# Patient Record
Sex: Female | Born: 1975 | Race: Black or African American | Hispanic: No | Marital: Married | Smoking: Never smoker
Health system: Southern US, Community
[De-identification: ages and names within clinical notes are randomized; demographics above are authoritative.]

## PROBLEM LIST (undated history)

## (undated) DIAGNOSIS — E119 Type 2 diabetes mellitus without complications: Secondary | ICD-10-CM

## (undated) DIAGNOSIS — C801 Malignant (primary) neoplasm, unspecified: Secondary | ICD-10-CM

## (undated) DIAGNOSIS — K219 Gastro-esophageal reflux disease without esophagitis: Secondary | ICD-10-CM

## (undated) DIAGNOSIS — I739 Peripheral vascular disease, unspecified: Secondary | ICD-10-CM

## (undated) DIAGNOSIS — I82409 Acute embolism and thrombosis of unspecified deep veins of unspecified lower extremity: Secondary | ICD-10-CM

## (undated) DIAGNOSIS — E785 Hyperlipidemia, unspecified: Secondary | ICD-10-CM

## (undated) DIAGNOSIS — Z973 Presence of spectacles and contact lenses: Secondary | ICD-10-CM

## (undated) DIAGNOSIS — N840 Polyp of corpus uteri: Secondary | ICD-10-CM

## (undated) DIAGNOSIS — F419 Anxiety disorder, unspecified: Secondary | ICD-10-CM

## (undated) DIAGNOSIS — N939 Abnormal uterine and vaginal bleeding, unspecified: Secondary | ICD-10-CM

## (undated) HISTORY — DX: Gastro-esophageal reflux disease without esophagitis: K21.9

## (undated) HISTORY — PX: HEMORROIDECTOMY: SUR656

## (undated) HISTORY — PX: TUBAL LIGATION: SHX77

## (undated) HISTORY — PX: ABDOMINAL HYSTERECTOMY: SHX81

## (undated) HISTORY — DX: Acute embolism and thrombosis of unspecified deep veins of unspecified lower extremity: I82.409

## (undated) HISTORY — DX: Type 2 diabetes mellitus without complications: E11.9

---

## 1998-01-01 ENCOUNTER — Inpatient Hospital Stay (HOSPITAL_COMMUNITY): Admission: AD | Admit: 1998-01-01 | Discharge: 1998-01-01 | Payer: Self-pay | Admitting: *Deleted

## 1998-04-07 ENCOUNTER — Inpatient Hospital Stay (HOSPITAL_COMMUNITY): Admission: AD | Admit: 1998-04-07 | Discharge: 1998-04-07 | Payer: Self-pay | Admitting: Obstetrics and Gynecology

## 1998-05-13 ENCOUNTER — Other Ambulatory Visit: Admission: RE | Admit: 1998-05-13 | Discharge: 1998-05-13 | Payer: Self-pay | Admitting: Obstetrics & Gynecology

## 1998-08-22 ENCOUNTER — Inpatient Hospital Stay (HOSPITAL_COMMUNITY): Admission: AD | Admit: 1998-08-22 | Discharge: 1998-08-22 | Payer: Self-pay | Admitting: Obstetrics & Gynecology

## 1998-08-29 ENCOUNTER — Inpatient Hospital Stay (HOSPITAL_COMMUNITY): Admission: AD | Admit: 1998-08-29 | Discharge: 1998-08-29 | Payer: Self-pay | Admitting: *Deleted

## 1998-09-27 ENCOUNTER — Inpatient Hospital Stay (HOSPITAL_COMMUNITY): Admission: AD | Admit: 1998-09-27 | Discharge: 1998-09-29 | Payer: Self-pay | Admitting: Obstetrics & Gynecology

## 1999-10-06 ENCOUNTER — Ambulatory Visit: Admission: RE | Admit: 1999-10-06 | Discharge: 1999-10-06 | Payer: Self-pay | Admitting: Family Medicine

## 1999-10-17 ENCOUNTER — Other Ambulatory Visit: Admission: RE | Admit: 1999-10-17 | Discharge: 1999-10-17 | Payer: Self-pay | Admitting: *Deleted

## 1999-12-02 ENCOUNTER — Ambulatory Visit (HOSPITAL_COMMUNITY): Admission: RE | Admit: 1999-12-02 | Discharge: 1999-12-02 | Payer: Self-pay | Admitting: *Deleted

## 1999-12-02 HISTORY — PX: LAPAROSCOPY WITH TUBAL LIGATION: SHX5576

## 2000-06-08 ENCOUNTER — Ambulatory Visit (HOSPITAL_COMMUNITY): Admission: RE | Admit: 2000-06-08 | Discharge: 2000-06-08 | Payer: Self-pay | Admitting: Family Medicine

## 2000-06-08 ENCOUNTER — Encounter: Payer: Self-pay | Admitting: Family Medicine

## 2000-11-15 ENCOUNTER — Emergency Department (HOSPITAL_COMMUNITY): Admission: EM | Admit: 2000-11-15 | Discharge: 2000-11-15 | Payer: Self-pay | Admitting: Internal Medicine

## 2003-03-30 ENCOUNTER — Emergency Department (HOSPITAL_COMMUNITY): Admission: EM | Admit: 2003-03-30 | Discharge: 2003-03-30 | Payer: Self-pay | Admitting: Emergency Medicine

## 2005-08-23 ENCOUNTER — Emergency Department (HOSPITAL_COMMUNITY): Admission: EM | Admit: 2005-08-23 | Discharge: 2005-08-23 | Payer: Self-pay | Admitting: Emergency Medicine

## 2006-05-10 ENCOUNTER — Emergency Department (HOSPITAL_COMMUNITY): Admission: EM | Admit: 2006-05-10 | Discharge: 2006-05-10 | Payer: Self-pay | Admitting: Emergency Medicine

## 2007-08-11 ENCOUNTER — Ambulatory Visit: Payer: Self-pay | Admitting: Family Medicine

## 2007-08-25 ENCOUNTER — Ambulatory Visit: Payer: Self-pay | Admitting: Family Medicine

## 2007-08-25 ENCOUNTER — Other Ambulatory Visit: Admission: RE | Admit: 2007-08-25 | Discharge: 2007-08-25 | Payer: Self-pay | Admitting: Family Medicine

## 2007-08-25 ENCOUNTER — Encounter: Payer: Self-pay | Admitting: Family Medicine

## 2007-08-25 DIAGNOSIS — E663 Overweight: Secondary | ICD-10-CM | POA: Insufficient documentation

## 2007-08-25 LAB — CONVERTED CEMR LAB
Albumin: 4.3 g/dL (ref 3.5–5.2)
Basophils Absolute: 0 10*3/uL (ref 0.0–0.1)
Bilirubin Urine: NEGATIVE
Chloride: 104 meq/L (ref 96–112)
Cholesterol: 159 mg/dL (ref 0–200)
Eosinophils Absolute: 0.1 10*3/uL (ref 0.0–0.7)
Eosinophils Relative: 0.5 % (ref 0.0–5.0)
Glucose, Bld: 97 mg/dL (ref 70–99)
Glucose, Urine, Semiquant: NEGATIVE
HCT: 39.8 % (ref 36.0–46.0)
Hemoglobin: 12.7 g/dL (ref 12.0–15.0)
Ketones, urine, test strip: NEGATIVE
Lymphocytes Relative: 31.1 % (ref 12.0–46.0)
MCHC: 31.9 g/dL (ref 30.0–36.0)
MCV: 78.5 fL (ref 78.0–100.0)
Magnesium: 2 mg/dL (ref 1.5–2.5)
Monocytes Relative: 4.7 % (ref 3.0–12.0)
Neutro Abs: 7.3 10*3/uL (ref 1.4–7.7)
Neutrophils Relative %: 63.4 % (ref 43.0–77.0)
Platelets: 412 10*3/uL — ABNORMAL HIGH (ref 150–400)
Potassium: 4.4 meq/L (ref 3.5–5.1)
Protein, U semiquant: NEGATIVE
RDW: 14.7 % — ABNORMAL HIGH (ref 11.5–14.6)
Sodium: 139 meq/L (ref 135–145)
Specific Gravity, Urine: 1.01
Total Bilirubin: 0.9 mg/dL (ref 0.3–1.2)
Total CHOL/HDL Ratio: 5
Total Protein: 7.7 g/dL (ref 6.0–8.3)
pH: 5.5

## 2008-06-07 ENCOUNTER — Ambulatory Visit: Payer: Self-pay | Admitting: Family Medicine

## 2008-06-07 DIAGNOSIS — G5600 Carpal tunnel syndrome, unspecified upper limb: Secondary | ICD-10-CM | POA: Insufficient documentation

## 2008-11-30 ENCOUNTER — Ambulatory Visit: Payer: Self-pay | Admitting: Family Medicine

## 2008-12-08 ENCOUNTER — Emergency Department (HOSPITAL_COMMUNITY): Admission: EM | Admit: 2008-12-08 | Discharge: 2008-12-08 | Payer: Self-pay | Admitting: Emergency Medicine

## 2008-12-19 ENCOUNTER — Ambulatory Visit: Payer: Self-pay | Admitting: Internal Medicine

## 2008-12-19 ENCOUNTER — Encounter: Admission: RE | Admit: 2008-12-19 | Discharge: 2008-12-19 | Payer: Self-pay | Admitting: Orthopedic Surgery

## 2008-12-19 ENCOUNTER — Telehealth: Payer: Self-pay | Admitting: Family Medicine

## 2008-12-19 ENCOUNTER — Inpatient Hospital Stay (HOSPITAL_COMMUNITY): Admission: EM | Admit: 2008-12-19 | Discharge: 2008-12-20 | Payer: Self-pay | Admitting: Emergency Medicine

## 2008-12-19 DIAGNOSIS — Z86718 Personal history of other venous thrombosis and embolism: Secondary | ICD-10-CM

## 2008-12-19 HISTORY — DX: Personal history of other venous thrombosis and embolism: Z86.718

## 2008-12-24 ENCOUNTER — Ambulatory Visit: Payer: Self-pay | Admitting: Family Medicine

## 2008-12-24 LAB — CONVERTED CEMR LAB: INR: 1.7

## 2008-12-28 ENCOUNTER — Ambulatory Visit: Payer: Self-pay | Admitting: Family Medicine

## 2008-12-28 LAB — CONVERTED CEMR LAB
INR: 1.5
Prothrombin Time: 14.8 s

## 2008-12-31 ENCOUNTER — Ambulatory Visit: Payer: Self-pay | Admitting: Family Medicine

## 2008-12-31 LAB — CONVERTED CEMR LAB: Prothrombin Time: 15.3 s

## 2009-01-03 ENCOUNTER — Ambulatory Visit: Payer: Self-pay | Admitting: Family Medicine

## 2009-01-10 ENCOUNTER — Ambulatory Visit: Payer: Self-pay | Admitting: Family Medicine

## 2009-01-10 LAB — CONVERTED CEMR LAB: INR: 2.9

## 2009-01-17 ENCOUNTER — Ambulatory Visit: Payer: Self-pay | Admitting: Family Medicine

## 2009-01-17 LAB — CONVERTED CEMR LAB: Prothrombin Time: 18.8 s

## 2009-02-14 ENCOUNTER — Ambulatory Visit: Payer: Self-pay | Admitting: Family Medicine

## 2009-02-14 LAB — CONVERTED CEMR LAB: Prothrombin Time: 17.5 s

## 2009-03-18 ENCOUNTER — Ambulatory Visit: Payer: Self-pay | Admitting: Family Medicine

## 2009-03-18 LAB — CONVERTED CEMR LAB
INR: 2.1
Prothrombin Time: 17.8 s

## 2009-04-30 ENCOUNTER — Ambulatory Visit: Payer: Self-pay | Admitting: Family Medicine

## 2009-07-02 HISTORY — PX: FLEXIBLE SIGMOIDOSCOPY: SHX1649

## 2009-07-03 ENCOUNTER — Encounter: Payer: Self-pay | Admitting: Gastroenterology

## 2009-07-03 ENCOUNTER — Ambulatory Visit (HOSPITAL_COMMUNITY): Admission: RE | Admit: 2009-07-03 | Discharge: 2009-07-03 | Payer: Self-pay | Admitting: Gastroenterology

## 2009-07-03 ENCOUNTER — Ambulatory Visit: Payer: Self-pay | Admitting: Gastroenterology

## 2009-07-03 DIAGNOSIS — K648 Other hemorrhoids: Secondary | ICD-10-CM | POA: Insufficient documentation

## 2009-07-04 ENCOUNTER — Telehealth: Payer: Self-pay | Admitting: Gastroenterology

## 2009-07-09 ENCOUNTER — Telehealth: Payer: Self-pay | Admitting: Gastroenterology

## 2009-07-31 ENCOUNTER — Telehealth (INDEPENDENT_AMBULATORY_CARE_PROVIDER_SITE_OTHER): Payer: Self-pay | Admitting: *Deleted

## 2009-09-05 ENCOUNTER — Emergency Department (HOSPITAL_COMMUNITY): Admission: EM | Admit: 2009-09-05 | Discharge: 2009-09-06 | Payer: Self-pay | Admitting: Emergency Medicine

## 2010-05-08 ENCOUNTER — Emergency Department (HOSPITAL_COMMUNITY)
Admission: EM | Admit: 2010-05-08 | Discharge: 2010-05-09 | Payer: Self-pay | Source: Home / Self Care | Admitting: Emergency Medicine

## 2010-05-15 ENCOUNTER — Other Ambulatory Visit
Admission: RE | Admit: 2010-05-15 | Discharge: 2010-05-15 | Payer: Self-pay | Source: Home / Self Care | Admitting: Family Medicine

## 2010-05-15 ENCOUNTER — Ambulatory Visit: Payer: Self-pay | Admitting: Family Medicine

## 2010-05-15 ENCOUNTER — Encounter: Payer: Self-pay | Admitting: Family Medicine

## 2010-05-16 ENCOUNTER — Telehealth (INDEPENDENT_AMBULATORY_CARE_PROVIDER_SITE_OTHER): Payer: Self-pay | Admitting: *Deleted

## 2010-05-16 LAB — CONVERTED CEMR LAB
AST: 25 units/L (ref 0–37)
Bilirubin, Direct: 0.1 mg/dL (ref 0.0–0.3)
CO2: 26 meq/L (ref 19–32)
Calcium: 9.6 mg/dL (ref 8.4–10.5)
Chloride: 105 meq/L (ref 96–112)
Creatinine, Ser: 0.7 mg/dL (ref 0.4–1.2)
Eosinophils Absolute: 0 10*3/uL (ref 0.0–0.7)
Eosinophils Relative: 0.3 % (ref 0.0–5.0)
GFR calc non Af Amer: 120.69 mL/min (ref >60.00)
Glucose, Bld: 90 mg/dL (ref 70–99)
LDL Cholesterol: 97 mg/dL (ref 0–99)
Lymphocytes Relative: 31.6 % (ref 12.0–46.0)
Lymphs Abs: 3.6 10*3/uL (ref 0.7–4.0)
MCV: 74.8 fL — ABNORMAL LOW (ref 78.0–100.0)
Neutrophils Relative %: 61.8 % (ref 43.0–77.0)
RBC: 4.68 M/uL (ref 3.87–5.11)
Sodium: 140 meq/L (ref 135–145)
Total Bilirubin: 0.5 mg/dL (ref 0.3–1.2)
Total CHOL/HDL Ratio: 5
Triglycerides: 124 mg/dL (ref 0.0–149.0)
VLDL: 24.8 mg/dL (ref 0.0–40.0)
Vit D, 25-Hydroxy: 16 ng/mL — ABNORMAL LOW (ref 30–89)

## 2010-05-19 ENCOUNTER — Encounter (INDEPENDENT_AMBULATORY_CARE_PROVIDER_SITE_OTHER): Payer: Self-pay | Admitting: *Deleted

## 2010-05-19 LAB — CONVERTED CEMR LAB: Pap Smear: NEGATIVE

## 2010-07-03 NOTE — Assessment & Plan Note (Signed)
Summary: tran from [email or web address]   Vital Signs:  Patient profile:   35 year old female Height:      67 inches Weight:      228 pounds BMI:     35.84 Pulse rate:   113 / minute BP sitting:   118 / 80  (left arm)  Vitals Entered By: Doristine Devoid CMA (May 15, 2010 10:44 AM) CC: CPX AND LAB W/ PAP   History of Present Illness: 35 yo woman here today for CPE.  no concerns today.  Preventive Screening-Counseling & Management  Alcohol-Tobacco     Alcohol drinks/day: <1     Smoking Status: never  Caffeine-Diet-Exercise     Does Patient Exercise: no      Sexual History:  currently monogamous.        Drug Use:  never.    Current Medications (verified): 1)  Bayer Childrens Aspirin 81 Mg Chew (Aspirin) .... Take One Tablet Daily  Allergies (verified): No Known Drug Allergies  Past History:  Past medical, surgical, family and social histories (including risk factors) reviewed, and no changes noted (except as noted below).  Past Medical History: Reviewed history from 07/03/2009 and no changes required. childbirth x 2 DVT GERD Hemorrhoids Rectal Bleeding  Past Surgical History: Reviewed history from 04/30/2009 and no changes required. Tubal ligation  Family History: Reviewed history from 07/03/2009 and no changes required. father 17, alive and well mother 35, alive and well two half brothers in good healthone full sister in good health No FH of Colon Cancer breast cancer- no  Social History: Reviewed history from 07/03/2009 and no changes required. Occupation: Web designer Married Never Smoked Drug use-no Regular exercise-no Alcohol Use - yes-2 glasses weekly Daily Caffeine Use-3 cups daily Drug Use:  never Sexual History:  currently monogamous  Review of Systems  The patient denies anorexia, fever, weight loss, weight gain, vision loss, decreased hearing, hoarseness, chest pain, syncope, dyspnea on exertion, peripheral edema,  prolonged cough, headaches, abdominal pain, melena, hematochezia, severe indigestion/heartburn, hematuria, suspicious skin lesions, depression, abnormal bleeding, enlarged lymph nodes, and breast masses.    Physical Exam  General:  Well-developed,well-nourished,in no acute distress; alert,appropriate and cooperative throughout examination Head:  Normocephalic and atraumatic without obvious abnormalities. No apparent alopecia or balding. Eyes:  No corneal or conjunctival inflammation noted. EOMI. Perrla. Funduscopic exam benign, without hemorrhages, exudates or papilledema. Vision grossly normal. Ears:  External ear exam shows no significant lesions or deformities.  Otoscopic examination reveals clear canals, tympanic membranes are intact bilaterally without bulging, retraction, inflammation or discharge. Hearing is grossly normal bilaterally. Nose:  External nasal examination shows no deformity or inflammation. Nasal mucosa are pink and moist without lesions or exudates. Mouth:  Oral mucosa and oropharynx without lesions or exudates.  Teeth in good repair. Neck:  No deformities, masses, or tenderness noted. Breasts:  No mass, nodules, thickening, tenderness, bulging, retraction, inflamation, nipple discharge or skin changes noted.   Lungs:  Normal respiratory effort, chest expands symmetrically. Lungs are clear to auscultation, no crackles or wheezes. Heart:  Normal rate and regular rhythm. S1 and S2 normal without gallop, murmur, click, rub or other extra sounds. Abdomen:  Bowel sounds positive,abdomen soft and non-tender without masses, organomegaly or hernias noted. Genitalia:  Pelvic Exam:        External: normal female genitalia without lesions or masses        Vagina: normal without lesions or masses        Cervix: normal without lesions or  masses        Adnexa: normal bimanual exam without masses or fullness        Uterus: normal by palpation        Pap smear: performed Pulses:  +2  carotid, radial, DP Extremities:  No clubbing, cyanosis, edema, or deformity noted with normal full range of motion of all joints.  1+ right pedal edema.   Neurologic:  No cranial nerve deficits noted. Station and gait are normal. Plantar reflexes are down-going bilaterally. DTRs are symmetrical throughout. Sensory, motor and coordinative functions appear intact. Skin:  Intact without suspicious lesions or rashes Cervical Nodes:  No lymphadenopathy noted Axillary Nodes:  No palpable lymphadenopathy Inguinal Nodes:  No significant adenopathy Psych:  Cognition and judgment appear intact. Alert and cooperative with normal attention span and concentration. No apparent delusions, illusions, hallucinations   Impression & Recommendations:  Problem # 1:  ROUTINE GYNECOLOGICAL EXAMINATION (ICD-V72.31) Assessment New pt's PE WNL w/ exception of obesity.  check labs.  encouraged healthy diet and regular exercise. Orders: Venipuncture ([address]) TLB-Lipid Panel (80061-LIPID) TLB-BMP (Basic Metabolic Panel-BMET) (80048-METABOL) TLB-CBC Platelet - w/Differential (85025-CBCD) TLB-Hepatic/Liver Function Pnl (80076-HEPATIC) TLB-TSH (Thyroid Stimulating Hormone) (84443-TSH) T-Vitamin D (25-Hydroxy) (60454-09811) Specimen Handling (91478)  Problem # 2:  SCREENING FOR MALIGNANT NEOPLASM OF THE CERVIX (ICD-V76.2) Assessment: New pap collected  Complete Medication List: 1)  Bayer Childrens Aspirin 81 Mg Chew (Aspirin) .... Take one tablet daily  Patient Instructions: 1)  Follow up in 1 year or as needed 2)  Your exam looks great!  Keep up the good work! 3)  Try and focus on healthy food choices and regular exercise (goal is 4x/week) 4)  We'll notify you of your lab results 5)  Call with any questions or concerns 6)  Happy Holidays!   Orders Added: 1)  Venipuncture [36415] 2)  TLB-Lipid Panel [80061-LIPID] 3)  TLB-BMP (Basic Metabolic Panel-BMET) [80048-METABOL] 4)  TLB-CBC Platelet -  w/Differential [85025-CBCD] 5)  TLB-Hepatic/Liver Function Pnl [80076-HEPATIC] 6)  TLB-TSH (Thyroid Stimulating Hormone) [84443-TSH] 7)  T-Vitamin D (25-Hydroxy) [29562-13086] 8)  Specimen Handling [99000] 9)  Est. Patient 18-39 years [[address]]

## 2010-07-03 NOTE — Progress Notes (Signed)
Summary: TRIAGE-rectal pain  Phone Note Call from Patient Call back at Home Phone [phone]   Caller: Patient Call For: Dr. Arlyce Dice Reason for Call: Talk to Nurse Summary of Call: pt had procedure last Wednesday and is still having a lot of rectal pain... would like more pain meds, is out of what she received for pain at hospital Initial call taken by: Vallarie Mare,  July 09, 2009 8:21 AM  Follow-up for Phone Call        Pt. had a Flex/Banding on Wednesday, she is still in pain and she has been unable to work since then, states the pain meds make her too sleepy.  1) Get a 'doughnut' pillow to sit on at work 2) Sitz baths QID 3) Use tylenol/Ibuprofen as needed 4) If symptoms become worse call back immediately or go to ER.   Follow-up by: Laureen Ochs LPN,  July 09, 2009 8:48 AM  Additional Follow-up for Phone Call Additional follow up Details #1::        ok to call in more tylenol #3 Additional Follow-up by: Louis Meckel MD,  July 09, 2009 10:58 AM    Additional Follow-up for Phone Call Additional follow up Details #2::    Above MD orders reviewed with patient. Tyl#3 1 refill called to pt. pharmacy ([facility] Pt. instructed to call back as needed.  Follow-up by: Laureen Ochs LPN,  July 09, 2009 11:09 AM

## 2010-07-03 NOTE — Letter (Signed)
Summary: [facility]  [address] [place], [place] [address]   Phone: [phone]  Fax: [phone]       Cassie Rich    01/27/1976    MRN: [id number]        Procedure Day /Date:07/03/2009 [facility]     Arrival Time:1:15PM     Procedure Time:2:15PM     Location of Procedure:                     X  [facility] ( Outpatient Registration)    PREPARATION FOR FLEXIBLE SIGMOIDOSCOPY WITH 2 FLEET ENEMAS/BANDING  TODAY PURCHASE 2 FLEET ENEMAS FROM THE DRUG STORE  _________________________________________________________________________________________________    FOR THE REST OF TODAY:  .   Do not drink anything colored red or purple.  Avoid juices with pulp.  No orange juice.              CLEAR LIQUIDS INCLUDE: Water Jello Ice Popsicles Tea (sugar ok, no milk/cream) Powdered fruit flavored drinks Coffee (sugar ok, no milk/cream) Gatorade Juice: apple, white grape, white cranberry  Lemonade Clear bullion, consomm, broth Carbonated beverages (any kind) Strained chicken noodle soup Hard Candy   ___________________________________________________________________________________________________  THE DAY OF YOUR PROCEDURE            DATE: 07/03/2009   DAY: TODAY  1.   Use Fleet Enema one hour prior to coming for procedure.  2.   You may drink clear liquids until 10:15AM (4 hours before exam)      MEDICATION INSTRUCTIONS  Unless otherwise instructed, you should take regular prescription medications with a small sip of water as early as possible the morning of your procedure.  PT HAS ALREADY COME OFF COUMADIN         OTHER INSTRUCTIONS  You will need a responsible adult at least 35 years of age to accompany you and drive you home.   This person must remain in the waiting room during your procedure.  Wear loose fitting clothing that is easily removed.  Leave jewelry and other valuables at home.  However, you may  wish to bring a book to read or an iPod/MP3 player to listen to music as you wait for your procedure to start.  Remove all body piercing jewelry and leave at home.  Total time from sign-in until discharge is approximately 2-3 hours.  You should go home directly after your procedure and rest.  You can resume normal activities the day after your procedure.  The day of your procedure you should not:   Drive   Make legal decisions   Operate machinery   Drink alcohol   Return to work  You will receive specific instructions about eating, activities and medications before you leave.   The above instructions have been reviewed and explained to me by   _______________________    I fully understand and can verbalize these instructions _____________________________ Date _________

## 2010-07-03 NOTE — Progress Notes (Signed)
Summary: Phone call received from patient  Patient called to speak with [name] with [employer]. I advised her that she was not in at the moment. She explained that her employer had not received a copy of her FMLA paperwork;however, we had a copy at [employer] for pickup. We are closed and when she goes to work and closed when she gets off so the patient would like her copy mailed to her at [address] [place], [place] [address]. Dena Chavis  July 31, 2009 10:53 AM

## 2010-07-03 NOTE — Procedures (Signed)
Summary: Prep/[facility]  Prep/[facility]   Imported By: Lester [place] 07/05/2009 09:38:43  _____________________________________________________________________  External Attachment:    Type:   Image     Comment:   External Document

## 2010-07-03 NOTE — Assessment & Plan Note (Signed)
Summary: bleeding hemorroids...em   History of Present Illness Visit Type: Initial Visit Primary GI MD: Melvia Heaps MD [facility] Primary Provider: Kelle Darting, MD Chief Complaint: Patient c/o 1-2 episodes of intermittent brbpr. She states that she has regular bowel movements, no constipation or diarrhea. She denies any abdominal pain. She does however, states that she does have some rectal pain and itching. History of Present Illness:   Cassie Rich is a 35 year old Afro-American female referred at the request of Dr. Tawanna Cooler for evaluation of hemorrhoids.  She has had recurrent problems consistenting of protrusion of hemorrhoids with a bowel movement.  She occasionally has had bright red blood per rectum and rectal itching.  She has taking suppositories in the past.  She moves her bowels regularly.   GI Review of Systems    Reports acid reflux and  heartburn.      Denies abdominal pain, belching, bloating, chest pain, dysphagia with liquids, dysphagia with solids, loss of appetite, nausea, vomiting, vomiting blood, weight loss, and  weight gain.      Reports hemorrhoids, rectal bleeding, and  rectal pain.     Denies anal fissure, black tarry stools, change in bowel habit, constipation, diarrhea, diverticulosis, fecal incontinence, heme positive stool, irritable bowel syndrome, jaundice, light color stool, and  liver problems.    Allergies: No Known Drug Allergies  Past History:  Past Medical History: childbirth x 2 DVT GERD Hemorrhoids Rectal Bleeding  Past Surgical History: Reviewed history from 04/30/2009 and no changes required. 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         All other systems were reviewed and were negative   Vital Signs:  Patient profile:   35 year old female Height:      67 inches Weight:      224 pounds Pulse rate:   84 / minute Pulse rhythm:   regular BP sitting:   122 / 74  Vitals Entered By: Hortense Ramal CMA Duncan Dull) (July 03, 2009 10:14 AM)  Physical Exam  Additional Exam:  She is a healthy-appearing female  skin: anicter  HEENT: normocephalic; PEERLA; no nasal or orpharyngeal abnormalities neck: supple nodes: no cervical adenopathy chest: clear cor:  no murmurs, gallops or rubs abd:  bowel sounds normoactive; no abdominal masses, tenderness, organomegaly rectal: no masses; stool heme negative; grade 3 hemorrhoids ext: no cyanosis, clubbing, or edema skeletal: no gross skeletal abnormalities neuro: alert, oriented x 3; no focal abnormalities    Impression & Recommendations:  Problem # 1:  INTERNAL HEMORRHOIDS (ICD-455.0)  Plan band ligation of her hemorrhoids  Risks, alternatives, and complications of the procedure, including bleeding, perforation, and possible need for surgery, were explained to the patient.  Patient's questions were answered.  Orders: ZFLEX with Banding (ZFL with Band)  Patient Instructions: 1)  Hemorrhoids brochure given.  2)  Your Flex with banding is scheduled for today at [facility] at 2:15 3)  The medication list was reviewed and reconciled.  All changed / newly prescribed medications were explained.  A complete medication list was provided to the patient / caregiver. Prescriptions: ANUSOL-HC 25  MG SUPP (HYDROCORTISONE ACETATE) 1 rectally at bedtime x 6 weeks  #45 x 1   Entered and Authorized by:   Louis Meckel MD   Signed by:   Louis Meckel MD on 07/03/2009   Method used:   Electronically to        [facility] (retail)       [address]       [place], [place]  [address]       Ph: [phone]       Fax: [phone]   RxID:   [id number]

## 2010-07-03 NOTE — Letter (Signed)
Summary: Results Letter  [facility]  [address] [place], [place] [address]   Phone: [phone]  Fax: [phone]        July 03, 2009 MRN: [id number]    ELLIA KNOWLTON [address] [place], [place]  [address]    Dear Ms. Huxford,  It is my pleasure to have treated you recently as a new patient in my office. I appreciate your confidence and the opportunity to participate in your care.  Since I do have a busy inpatient endoscopy schedule and office schedule, my office hours vary weekly. I am, however, available for emergency calls everyday through my office. If I am not available for an urgent office appointment, another one of our gastroenterologist will be able to assist you.  My well-trained staff are prepared to help you at all times. For emergencies after office hours, a physician from our Gastroenterology section is always available through my 24 hour answering service  Once again I welcome you as a new patient and I look forward to a happy and healthy relationship             Sincerely,  Louis Meckel MD  This letter has been electronically signed by your physician.  Appended Document: Results Letter letter mailed

## 2010-07-03 NOTE — Progress Notes (Signed)
Summary: TRIAGE  Phone Note Call from Patient Call back at [facility] Phone [phone]   Call For: Dr Arlyce Dice Summary of Call: [facility] called her about some suppositories. When does she start this? Initial call taken by: Leanor Kail [facility],  July 04, 2009 3:44 PM  Follow-up for Phone Call        Pt. was seen yesterday and had a flex/banding. Does she still need to get the Anusol Suppositories? Follow-up by: Laureen Ochs LPN,  July 04, 2009 3:52 PM  Additional Follow-up for Phone Call Additional follow up Details #1::        Actually let's hold on the suppositories for now Additional Follow-up by: Louis Meckel MD,  July 05, 2009 8:51 AM    Additional Follow-up for Phone Call Additional follow up Details #2::    Above MD orders reviewed with patient. Pt. instructed to call back as needed.  Follow-up by: Laureen Ochs LPN,  July 05, 2009 9:33 AM

## 2010-07-03 NOTE — Letter (Signed)
Summary: Results Follow up Letter   at [facility]  [address] [place], [place] [address]   Phone: [phone]  Fax: [phone]    05/19/2010 MRN: [id number]  SHEILY LINEMAN [address] [place], [place]  [address]  Dear Ms. Malkin,  The following are the results of your recent test(s):  Test         Result    Pap Smear:        Normal __X___  Not Normal _____ Comments: REPEAT 1 YR.

## 2010-07-03 NOTE — Procedures (Signed)
Summary: Flexible Sigmoidoscopy  Patient: [name] Note: All result statuses are Final unless otherwise noted.  Tests: (1) Flexible Sigmoidoscopy (FLX)  FLX Flexible Sigmoidoscopy                             DONE     [facility]     [address] [place], [place]  [address]           FLEXIBLE SIGMOIDOSCOPY PROCEDURE REPORT           PATIENT:  Cassie Rich, Cassie Rich  MR#:  [id number]     BIRTHDATE:  [birth year], 33 yrs. old  GENDER:  female           ENDOSCOPIST:  Barbette Hair. Arlyce Dice, MD     Referred by:           PROCEDURE DATE:  07/03/2009     PROCEDURE:  Flexible Sigmoidoscopy with banding     ASA CLASS:  Class I     INDICATIONS:  treatment of hemorrhoids           MEDICATIONS:   Fentanyl 100 mcg IV, Versed 10 mg IV           DESCRIPTION OF PROCEDURE:   After the risks benefits and     alternatives of the procedure were thoroughly explained, informed     consent was obtained.  Digital rectal exam was performed and     revealed prolasped hemorrhoids.   The  endoscope was introduced     through the anus and advanced to the sigmoid colon, without     limitations.  The quality of the prep was .  The instrument was     then slowly withdrawn as the mucosa was fully examined.           Internal hemorrhoids were found in the rectum. grade 3     hemorrhoids; 3 internal hemorrhoid bundles hemorrhoidal banding 5     bands were placed circumferentially above the dendate line     Retroflexed views in the rectum revealed no abnormalities.    The     scope was then withdrawn from the patient and the procedure     terminated.           COMPLICATIONS:  None           ENDOSCOPIC IMPRESSION:     1) Internal hemorrhoids in the rectum - s/p band ligation     RECOMMENDATIONS:     1) Call the office to schedule a followup office visit for 1     month           REPEAT EXAM:  No           ______________________________     Barbette Hair. Arlyce Dice, MD           CC:  Roderick Pee, MD           n.     Rosalie Doctor:   Barbette Hair. Brylynn Hanssen at 07/03/2009 04:30 PM           Victory Dakin, [phone]  Note: An exclamation mark (!) indicates a result that was not dispersed into the flowsheet. Document Creation Date: 07/03/2009 4:31 PM _______________________________________________________________________  (1) Order result status: Final Collection or observation date-time: 07/03/2009 16:20 Requested date-time:  Receipt date-time:  Reported date-time:  Referring Physician:   Ordering Physician: Melvia Heaps [phone]) Specimen Source:  Source: Launa Grill Order Number: [id number] Lab site:

## 2010-07-03 NOTE — Progress Notes (Signed)
Summary: labs-  Phone Note Outgoing Call   Call placed by: Doristine Devoid CMA,  May 16, 2010 11:08 AM Call placed to: Patient Summary of Call: level is low.  needs to start 50,000 units of vit D weekly x12 weeks and then repeat level   Follow-up for Phone Call        left message on machine ........Marland KitchenDoristine Devoid CMA  May 16, 2010 11:08 AM   spoke w/ patient aware of labs and prescription sent to pharmacy .Marland KitchenMarland KitchenMarland KitchenDoristine Devoid CMA  May 16, 2010 11:17 AM     New/Updated Medications: VITAMIN D (ERGOCALCIFEROL) 50000 UNIT CAPS (ERGOCALCIFEROL) take one tablet weekly x12 weeks Prescriptions: VITAMIN D (ERGOCALCIFEROL) 50000 UNIT CAPS (ERGOCALCIFEROL) take one tablet weekly x12 weeks  #12 x 0   Entered by:   Doristine Devoid CMA   Authorized by:   Neena Rhymes MD   Signed by:   Doristine Devoid CMA on 05/16/2010   Method used:   Electronically to        [facility] (retail)       [address]       [place], [place]  [address]       Ph: [phone]       Fax: [phone]   RxID:   [id number]

## 2010-08-11 LAB — BASIC METABOLIC PANEL
CO2: 25 mEq/L (ref 19–32)
Calcium: 9.4 mg/dL (ref 8.4–10.5)
Chloride: 105 mEq/L (ref 96–112)
Creatinine, Ser: 0.86 mg/dL (ref 0.4–1.2)
GFR calc Af Amer: >60 mL/min (ref >60)
GFR calc non Af Amer: >60 mL/min (ref >60)
Potassium: 3.5 mEq/L (ref 3.5–5.1)
Sodium: 137 mEq/L (ref 135–145)

## 2010-08-11 LAB — CBC
Hemoglobin: 11.9 g/dL — ABNORMAL LOW (ref 12.0–15.0)
MCH: 24.7 pg — ABNORMAL LOW (ref 26.0–34.0)
MCHC: 33 g/dL (ref 30.0–36.0)
WBC: 10.9 10*3/uL — ABNORMAL HIGH (ref 4.0–10.5)

## 2010-08-20 LAB — POCT I-STAT, CHEM 8
Chloride: 106 mEq/L (ref 96–112)
Creatinine, Ser: 0.6 mg/dL (ref 0.4–1.2)
HCT: 36 % (ref 36.0–46.0)
Hemoglobin: 12.2 g/dL (ref 12.0–15.0)

## 2010-09-07 LAB — DIFFERENTIAL
Basophils Relative: 0 % (ref 0–1)
Basophils Relative: 0 % (ref 0–1)
Eosinophils Absolute: 0 10*3/uL (ref 0.0–0.7)
Eosinophils Relative: 1 % (ref 0–5)
Lymphs Abs: 4.4 10*3/uL — ABNORMAL HIGH (ref 0.7–4.0)
Monocytes Absolute: 0.6 10*3/uL (ref 0.1–1.0)
Monocytes Relative: 5 % (ref 3–12)
Neutro Abs: 6.3 10*3/uL (ref 1.7–7.7)
Neutrophils Relative %: 50 % (ref 43–77)

## 2010-09-07 LAB — CBC
HCT: 37.6 % (ref 36.0–46.0)
Hemoglobin: 12.3 g/dL (ref 12.0–15.0)
MCHC: 32.7 g/dL (ref 30.0–36.0)
MCHC: 33.3 g/dL (ref 30.0–36.0)
MCV: 77.7 fL — ABNORMAL LOW (ref 78.0–100.0)
MCV: 78.2 fL (ref 78.0–100.0)
Platelets: 305 10*3/uL (ref 150–400)
Platelets: 337 10*3/uL (ref 150–400)
RBC: 4.81 MIL/uL (ref 3.87–5.11)
RDW: 15.7 % — ABNORMAL HIGH (ref 11.5–15.5)
WBC: 11.4 10*3/uL — ABNORMAL HIGH (ref 4.0–10.5)

## 2010-09-07 LAB — URINE CULTURE: Colony Count: >=100000

## 2010-09-07 LAB — URINALYSIS, ROUTINE W REFLEX MICROSCOPIC
Nitrite: POSITIVE — AB
Specific Gravity, Urine: 1.022 (ref 1.005–1.030)
Urobilinogen, UA: 1 mg/dL (ref 0.0–1.0)

## 2010-09-07 LAB — BASIC METABOLIC PANEL
BUN: 6 mg/dL (ref 6–23)
BUN: 7 mg/dL (ref 6–23)
CO2: 27 mEq/L (ref 19–32)
Chloride: 107 mEq/L (ref 96–112)
Creatinine, Ser: 0.82 mg/dL (ref 0.4–1.2)
Creatinine, Ser: 0.93 mg/dL (ref 0.4–1.2)
GFR calc non Af Amer: >60 mL/min (ref >60)
Potassium: 3.2 mEq/L — ABNORMAL LOW (ref 3.5–5.1)
Sodium: 139 mEq/L (ref 135–145)

## 2010-09-07 LAB — URINE MICROSCOPIC-ADD ON

## 2010-09-07 LAB — PROTIME-INR
INR: 1 (ref 0.00–1.49)
INR: 1.1 (ref 0.00–1.49)
Prothrombin Time: 13.3 s (ref 11.6–15.2)
Prothrombin Time: 14.4 seconds (ref 11.6–15.2)

## 2010-09-07 LAB — HEPARIN LEVEL (UNFRACTIONATED): Heparin Unfractionated: <0.1 IU/mL — ABNORMAL LOW (ref 0.30–0.70)

## 2010-09-07 LAB — APTT: aPTT: 29 seconds (ref 24–37)

## 2010-09-07 LAB — PREGNANCY, URINE: Preg Test, Ur: NEGATIVE

## 2010-09-07 LAB — MAGNESIUM: Magnesium: 2.2 mg/dL (ref 1.5–2.5)

## 2010-10-13 ENCOUNTER — Encounter: Payer: Self-pay | Admitting: Family Medicine

## 2010-10-13 ENCOUNTER — Encounter: Payer: Self-pay | Admitting: *Deleted

## 2010-10-14 NOTE — H&P (Signed)
NAMEWESLEIGH, Rich NO.:  [id number]   MEDICAL RECORD NO.:  [id number]          PATIENT TYPE:  INP   LOCATION:  0104                         FACILITY:  [facility]   PHYSICIAN:  Ramiro Harvest, MD    DATE OF BIRTH:  Jan 11, 1976   DATE OF ADMISSION:  12/19/2008  DATE OF DISCHARGE:                              HISTORY & PHYSICAL   PRIMARY CARE PHYSICIAN:  Tinnie Gens A. Tawanna Cooler, M.D.   ORTHOPEDIST:  Leonides Grills, M.D.   HISTORY OF PRESENT ILLNESS:  Cassie Rich is a 35 year old African  American female with history of bilateral tubal ligation, recent ankle  fracture on December 08, 2008, who presented to the ED with right lower  extremity pain.  The patient stated that she fractured her right ankle  on December 08, 2008 and was seen by ortho and had a cast placed on December 10, 2008.  The patient went for follow-up x-rays on the day of admission and  was complaining of a 2-day history of right lower extremity/right calf  pain after returning from a trip to [place].  Doppler ultrasounds were  done which showed a right lower extremity DVT and the patient was sent  to the ED.  The patient denies any fevers.  No chills, no nausea, no  vomiting.  No chest pain, no shortness of breath, no abdominal pain, no  dysuria, no focal neurological symptoms.  No cough, no other associated  symptoms.  The patient was seen in the ED.  Urine pregnancy HCG was  negative.  Urinalysis is nitrite positive with small leukocytes, wbc's 3-  6.  BMET with a potassium of 3.2, otherwise was within normal limits.  CBC with a white count of 11.4, otherwise was within normal limits.  We  were called to admit the patient for further evaluation and management.   ALLERGIES:  NO KNOWN DRUG ALLERGIES.   PAST MEDICAL HISTORY:  1. Status post laparoscopic bilateral tubal ligation per Dr. Malachy Mood,      December 02, 1999.  2. Ankle fracture, December 08, 2008.  3. Right carpal tunnel syndrome.  4. External hemorrhoids.   HOME MEDICATIONS:  1. Oxycodone 5 mg p.o. q.4 h p.r.n. pain.  2. Ibuprofen 600 mg p.o. 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  No tobacco use.  Occasional  alcohol use.  No IV drug use.   FAMILY HISTORY:  Father alive, age 71, with diabetes.  Mother alive, age  44 and healthy.  Has two half brothers who are healthy.  One sister who  is healthy.  No family history of DVT or PE.   REVIEW OF SYSTEMS:  As per HPI, otherwise negative.   PHYSICAL EXAMINATION:  VITAL SIGNS:  Temperature 98.7, blood pressure  122/87, pulse of 79, respirations 20, satting 99% on room air.  GENERAL:  Patient lying on gurney in no apparent distress.  HEENT: Normocephalic, atraumatic.  Pupils equal, round and reactive to  light and accommodation.  Extraocular movements intact.  Oropharynx is  clear.  No lesions, no exudates.  NECK:  Supple.  No lymphadenopathy.  RESPIRATORY:  Lungs are clear to auscultation bilaterally.  CARDIOVASCULAR:  Regular rate and rhythm.  No murmurs, rubs or gallops.  ABDOMEN:  Soft, nontender, nondistended.  Positive bowel sounds.  EXTREMITIES:  No clubbing, cyanosis or edema.  NEUROLOGIC:  The patient is alert and oriented x3.  Cranial nerves II-  XII are grossly intact.  No focal deficits.   LABORATORY DATA:  CBC:  White count 11.4, hemoglobin 12.3, hematocrit  37.6, platelet count of 337, ANC of 6.3.  PT of 13.3, INR 1, PTT of 29.  BMET with a sodium of 139, potassium 3.2, chloride 107, bicarb 25,  glucose 91, BUN 6, creatinine 0.82, calcium of 9.2.  Urine pregnancy  test HCG was negative.  UA was amber, cloudy, specific gravity 1.022, pH  of 5.5, glucose negative, bilirubin negative, ketones trace blood, large  protein to 100.  Urobilinogen 1, nitrite positive, leukocytes small.  Urine microscopy:  Wbc's 3-6, rbc's too numerous to count.  Bacteria was  many.   Doppler ultrasound of the right lower extremity shows a right  calf perineal DVT.  The remainder of the right lower extremity deep  veins are patent.   ASSESSMENT/PLAN:  Cassie Rich is a 35 year old female with a  recent ankle fracture.  Placed in a cast and a recent long car ride to  [place] and back who presents to the ED with a right lower extremity  DVT.  1. Right lower extremity deep venous thrombosis, likely secondary to      right lower extremity immobilization with cast secondary to ankle      fracture and recent long car ride.  Check a hepatic panel.  Check a      chest x-ray, check a magnesium level.  Will place on Coumadin with      a Lovenox bridge as this is the patient's first DVT.  Will likely      need anywhere from 6-12 months of treatment.  2. Probable urinary tract infection.  Check a urine culture.  Will      place on Cipro.  3. Hypokalemia.  Check a magnesium level and replete.  4. Leukocytosis likely secondary to problems #1 and #2.  Check a chest      x-ray.  Will place on Cipro and follow.  5. Prophylaxis.  Pepcid for GI prophylaxis.  Lovenox/Coumadin for DVT.   It has been a pleasure taking care of [name].      Ramiro Harvest, MD  Electronically Signed     DT/MEDQ  D:  12/19/2008  T:  12/19/2008  Job:  [id number]   cc:   Tinnie Gens A. Tawanna Cooler, MD  [address] [place]  [place] [address]   Leonides Grills, M.D.  Fax: [phone]

## 2010-10-14 NOTE — Discharge Summary (Signed)
Cassie Rich, Cassie Rich NO.:  [id number]   MEDICAL RECORD NO.:  [id number]            PATIENT TYPE:   LOCATION:                                 FACILITY:   PHYSICIAN:  Corwin Levins, MD           DATE OF BIRTH:   DATE OF ADMISSION:  DATE OF DISCHARGE:  12/20/2008                               DISCHARGE SUMMARY   DISCHARGE DIAGNOSES:  1. Right lower extremity deep venous thrombosis  2. Recent ankle fracture with casting.  3. Urinary tract infection.  4. Hypokalemia.  5. Mild anemia.  6. Status post laparoscopic bilateral tubal ligation.  7. Right carpal tunnel syndrome.  8. History of external hemorrhoids.  9. Current menses.   PROCEDURES:  Venous Doppler ultrasound December 19, 2008, with a right calf  perineal deep venous thrombosis with the remainder of the right lower  extremity deep veins patent.   CONSULTATION:  None.   HISTORY AND PHYSICAL:  see that as dictated per Dr. Janee Morn on  admission December 19, 2008.   HOSPITAL COURSE:  Cassie Rich is very nice 35 year old African-American  female patient Dr. Tawanna Cooler as well as Dr. Lestine Box in orthopedics who  presented post ankle fracture, December 08, 2008, with worsening right lower  extremity pain after a prolonged car trip from [place].  Also Doppler  ultrasound did show right lower extremity DVT.  The patient was sent to  the ED.  she essentially denied any symptoms such as chest pain,  shortness of breath, palpitations, dizziness.  The urine HCG is  negative.  A UA did show positive nitrites, small leukocyte Estrace and  elevated white cells.  B-met showed potassium 3.2, magnesium was normal.  The initial white cell count was slightly high at 11.4 which normalized  overnight.  Hemoglobin was initially 12.3 with follow up July 22 at 10.8  with MCV of 77.7.  The right lower extremity cast was removed prior to  Doppler ultrasound evaluations.  she currently has the pieces in place  with Ace wrap but this will be  replaced with a full intact cast prior to  discharge.  She will undergo Lovenox and Coumadin teaching.  Quite low  potassium to be replaced.  She will be discharged on Lovenox and  Coumadin.  Anticipated total Coumadin treatment will be for six months.  I discussed with the patient regarding diet and avoiding NSAIDs  as well  as any other medication that might affect Coumadin level.  She received  Coumadin 10 mg on July 21 and the same today, July 22.  She will receive  another 10 mg dose, and she will be directed to use 5 mg Coumadin with  the [facility] to be arranged for July 26 with  Shelby Dubin.   DISPOSITION:  Discharged to home in good condition.  There are no  activity or dietary restrictions.   DISCHARGE MEDICATIONS:  Include:  1. Lovenox 90 mg subcu b.i.d. for 10 days.  2. Coumadin with dosing as above with anticipated maintenance dose of  5 mg with follow up in the [facility] July 26 with final      maintenance dosing determination to be determined at that time.   She is asked to follow up in 1-2 weeks with Dr. Tawanna Cooler. She will follow up  with Orthopedics as planned as well.   DISCHARGE MEDICATIONS:  1. Oxycodone 5 mg 1-q 4 to 6 p.r.n. pain, that she already has at      home.  2. Ciprofloxacin 500 mg p.o. b.i.d.  3. Iron 325 mg one p.o. q day.   She is instructed to avoid anti-inflammatory medications.  It is also  recommended that on returned to Dr. Tawanna Cooler, she have her hemoglobin and  potassium rechecked.      Corwin Levins, MD  Electronically Signed     JWJ/MEDQ  D:  12/20/2008  T:  12/20/2008  Job:  [id number]   cc:   Dr. L[facility]

## 2010-10-17 NOTE — H&P (Signed)
[facility]  Patient:    Cassie Rich, Cassie Rich                   MRN: [id number] Adm. Date:  [id number] Attending:  Donne Hazel                         History and Physical  HISTORY OF PRESENT ILLNESS:  Ms. Printup is a 35 year old female, gravida 2, para 2, admitted for permanent, tubal sterilization.  The patient was thoroughly counselled regarding the risks and benefits of bilateral tubal ligation.  She requested permanent, tubal sterilization.  The patient was also explained that this is a permanent procedure with a failure rate of 1 in 200. Thorough counselling was undertaken.  The patient seemed to understand and wished to proceed with permanent sterilization.  She is thus admitted for laparoscopic bilateral tubal ligation.  MEDICAL HISTORY:  None.  OBSTETRICAL HISTORY:  Normal spontaneous vaginal delivery x 2.  SURGICAL HISTORY:  None.  SOCIAL HISTORY:  Positive for occasional alcohol, she denies any illicit drug usage or smoking.  CURRENT MEDICATIONS:  None.  ALLERGIES:  None known.  PHYSICAL EXAMINATION:  VITAL SIGNS:  Stable, blood pressure 108/70, afebrile.  GENERAL:  She is a well-developed, well-nourished female in no acute distress.  HEENT:  Within normal limits.  NECK:  Supple without adenopathy or thyromegaly.  LUNGS:  Clear to auscultation.  HEART:  Regular rate and rhythm without murmur, gallop or rub.  BREASTS:  Exam done in the office recently was normal, but it is deferred upon admission.  ABDOMEN:  Benign and soft with no masses, tenderness, hernia, or organomegaly.  EXTREMITIES:  Neurologically grossly normal.  PELVIC:  Normal external female genitalia, vagina and cervix clear.  The uterus is small, nontender, mobile.  The adnexa clear.  ADMITTING DIAGNOSES:  Request permanent, voluntary sterilization.  PLAN:  Laparoscopic bilateral tubal ligation.  DISCUSSION:  The risks and benefits of surgery were  explained to patient.  The risk of bleeding, infection, risk of injury to surrounding organs explained. The patient was allowed to ask [place] questions and wished to proceed with permanent, tubal sterilization.  A failure rate of 1 in 200 explained. Questions were answered regarding the procedure. DD:  12/02/99 TD:  12/02/99 Job: [id number]

## 2010-10-17 NOTE — Op Note (Signed)
[facility]  Patient:    Cassie Rich, Cassie Rich                   MRN: [id number] Proc. Date: 12/02/99 Adm. Date:  [id number] Attending:  Donne Hazel                           Operative Report  PREOPERATIVE DIAGNOSIS:       Requests permanent voluntary sterilization.  POSTOPERATIVE DIAGNOSIS:      Request permanent voluntary sterilization.  PROCEDURE:                    Laparoscopic bilateral tubal ligation by electrocautery.  SURGEON:                      Willey Blade, M.D.  ANESTHESIA:                   General endotracheal.  ESTIMATED BLOOD LOSS:         Less than 20 cc.  COMPLICATIONS:                None.  FINDINGS:                     Normal pelvis.  DESCRIPTION OF PROCEDURE:     The patient was taken to the operating room, where a general endotracheal anesthetic was administered.  The patient was placed on the operating table in the dorsal lithotomy position.  The abdomen, perineum and vagina were prepped and draped in the usual sterile fashion with Betadine and sterile drapes.  A Hulka tenaculum was placed inside the intrauterine cavity for uterine manipulation.  The bladder was then emptied with a red rubber catheter completely.  Next, a small vertical infraumbilical skin incision was made, through which a Veress needle was inserted atraumatically into the abdominal cavity.  Then, 2 L of carbon dioxide gas were then insufflated to create a pneumoperitoneum.  The Veress needle was then removed and a disposable laparoscopic trocar was inserted atraumatically into the abdominal cavity.  The laparoscope was then placed with the ab0ove noted findings.  The pelvis was visualized and noted to be normal. Bilateral tubal ligation was then carried out.  First, the left tube was identified and traced to its fimbriated end to ensure its positive identification.  The tube was then grasped approximately 2 cm away from the uterine fundus, and  the tube was thoroughly cauterized using the Klepinger bipolar cautery.  Two successive burns were placed completely and thoroughly, incorporating some of the mesosalpinx.  This was done completely and thoroughly, and encompassed an approximate 2.5 cm segment of tube with a complete burn.  The same procedure was repeated on the right tube with a thorough cautery placed.  All gas was then allowed to escape from the abdomen.  The operative areas were hemostatic. The gas was allowed to escaped completely.  All abdominal instruments were removed.  The muscle rectus fascia was then closed with two interrupted sutures of 0 Vicryl.  The skin was reapproximated with multiple interrupted sutures of 3-0 Vicryl Rapide.  All vaginal instruments were then removed. The patient was awakened, extubated and taken to the recovery room in good condition.  There were no perioperative complications. DD:  12/02/99 TD:  12/02/99 Job: [id number]

## 2011-09-17 ENCOUNTER — Encounter: Payer: Self-pay | Admitting: Family Medicine

## 2011-09-17 ENCOUNTER — Ambulatory Visit (INDEPENDENT_AMBULATORY_CARE_PROVIDER_SITE_OTHER): Payer: 59 | Admitting: Family Medicine

## 2011-09-17 VITALS — BP 122/78 | HR 79 | Temp 97.9°F | Ht 66.0 in | Wt 227.2 lb

## 2011-09-17 DIAGNOSIS — N390 Urinary tract infection, site not specified: Secondary | ICD-10-CM | POA: Insufficient documentation

## 2011-09-17 LAB — POCT URINALYSIS DIPSTICK
Bilirubin, UA: NEGATIVE
Nitrite, UA: NEGATIVE
pH, UA: 6

## 2011-09-17 MED ORDER — CEPHALEXIN 500 MG PO CAPS: 500.0000 mg | ORAL_CAPSULE | Freq: Two times a day (BID) | ORAL | Status: DC

## 2011-09-17 NOTE — Patient Instructions (Signed)
This appears to be a UTI Start the Keflex twice daily x5 days Drink plenty of fluids Happy Belated Birthday!!! Hang in there!!!

## 2011-09-17 NOTE — Progress Notes (Signed)
  Subjective:    Patient ID: Cassie Rich, female    DOB: 06/13/1975, 36 y.o.   MRN: [id number]  HPI ? UTI- dysuria started last week, lower abdominal pressure x2 weeks, + hesitancy, incomplete emptying.  No hematuria.  No fevers.  Hx of similar but 'years ago'.   Review of Systems For ROS see HPI     Objective:   Physical Exam  Vitals reviewed. Constitutional: She appears well-developed and well-nourished. No distress.  Abdominal: Soft. She exhibits no distension. There is no tenderness (no suprapubic or CVA tenderness).          Assessment & Plan:

## 2011-09-17 NOTE — Assessment & Plan Note (Signed)
New.  Pt's hx and UA consistent w/ infxn.  Start abx.  Reviewed supportive care and red flags that should prompt return.  Pt expressed understanding and is in agreement w/ plan.

## 2011-09-20 LAB — CULTURE, URINE COMPREHENSIVE

## 2011-09-28 ENCOUNTER — Telehealth: Payer: Self-pay | Admitting: Family Medicine

## 2011-09-28 ENCOUNTER — Other Ambulatory Visit (INDEPENDENT_AMBULATORY_CARE_PROVIDER_SITE_OTHER): Payer: 59

## 2011-09-28 DIAGNOSIS — N390 Urinary tract infection, site not specified: Secondary | ICD-10-CM

## 2011-09-28 LAB — POCT URINALYSIS DIPSTICK
Bilirubin, UA: NEGATIVE
Glucose, UA: NEGATIVE
Leukocytes, UA: NEGATIVE
Nitrite, UA: POSITIVE

## 2011-09-28 NOTE — Telephone Encounter (Signed)
Patient would like lab results from 4.18.13. Patient also states she is still having some discomfort Please call at [phone]-patient states is ok to leave a detailed message, if she does not answer

## 2011-09-28 NOTE — Telephone Encounter (Signed)
Pt called back c/o urinary pressure, pinching feel with urination. Pt also note some discomfort vaginally with intercourse. Pt offer appt but unable to come in due to work hours. Pt would like to know if she can recheck urine in lab..Please advise

## 2011-09-28 NOTE — Telephone Encounter (Signed)
.  left message to have patient return my call. Noted results that she does have UTI and is being treated appropriately,advised to call back in to clarify current discomfort to note if she needs an OV or not.

## 2011-09-28 NOTE — Telephone Encounter (Signed)
Pt aware.

## 2011-09-28 NOTE — Telephone Encounter (Signed)
Can leave urine for testing but if having recurrent infxns needs OV

## 2011-09-29 MED ORDER — CEPHALEXIN 500 MG PO CAPS: 500.0000 mg | ORAL_CAPSULE | Freq: Two times a day (BID) | ORAL | Status: AC

## 2011-10-01 LAB — URINE CULTURE: Colony Count: >=100000

## 2012-06-20 ENCOUNTER — Emergency Department (HOSPITAL_COMMUNITY)
Admission: EM | Admit: 2012-06-20 | Discharge: 2012-06-20 | Disposition: A | Discharge status: Home / Self Care | Payer: 59 | Attending: Emergency Medicine | Admitting: Emergency Medicine

## 2012-06-20 ENCOUNTER — Emergency Department (HOSPITAL_COMMUNITY): Payer: 59

## 2012-06-20 ENCOUNTER — Encounter (HOSPITAL_COMMUNITY): Payer: Self-pay | Admitting: *Deleted

## 2012-06-20 DIAGNOSIS — M79609 Pain in unspecified limb: Secondary | ICD-10-CM | POA: Insufficient documentation

## 2012-06-20 DIAGNOSIS — R609 Edema, unspecified: Secondary | ICD-10-CM | POA: Insufficient documentation

## 2012-06-20 DIAGNOSIS — R0609 Other forms of dyspnea: Secondary | ICD-10-CM | POA: Insufficient documentation

## 2012-06-20 DIAGNOSIS — M79606 Pain in leg, unspecified: Secondary | ICD-10-CM

## 2012-06-20 DIAGNOSIS — Z8679 Personal history of other diseases of the circulatory system: Secondary | ICD-10-CM | POA: Insufficient documentation

## 2012-06-20 DIAGNOSIS — Z8719 Personal history of other diseases of the digestive system: Secondary | ICD-10-CM | POA: Insufficient documentation

## 2012-06-20 DIAGNOSIS — R0989 Other specified symptoms and signs involving the circulatory and respiratory systems: Secondary | ICD-10-CM | POA: Insufficient documentation

## 2012-06-20 DIAGNOSIS — Z86718 Personal history of other venous thrombosis and embolism: Secondary | ICD-10-CM | POA: Insufficient documentation

## 2012-06-20 LAB — CBC WITH DIFFERENTIAL/PLATELET
Basophils Absolute: 0 10*3/uL (ref 0.0–0.1)
Basophils Relative: 0 % (ref 0–1)
Eosinophils Absolute: 0 10*3/uL (ref 0.0–0.7)
Hemoglobin: 12.4 g/dL (ref 12.0–15.0)
MCHC: 33.2 g/dL (ref 30.0–36.0)
Neutro Abs: 7.3 10*3/uL (ref 1.7–7.7)
Neutrophils Relative %: 68 % (ref 43–77)
Platelets: 301 10*3/uL (ref 150–400)
RDW: 14.9 % (ref 11.5–15.5)

## 2012-06-20 LAB — D-DIMER, QUANTITATIVE: D-Dimer, Quant: <0.27 ug/mL-FEU (ref 0.00–0.48)

## 2012-06-20 MED ORDER — TRAMADOL HCL 50 MG PO TABS: 50.0000 mg | ORAL_TABLET | Freq: Four times a day (QID) | ORAL | Status: DC | PRN

## 2012-06-20 NOTE — ED Provider Notes (Addendum)
History     CSN: [id number]  Arrival date & time 06/20/12  0912   First MD Initiated Contact with Patient 06/20/12 [phone]      Chief Complaint  Patient presents with  . Leg Pain    (Consider location/radiation/quality/duration/timing/severity/associated sxs/prior treatment) HPI The patient presents with concerns of new leg pain.  Initially the pain was about her ankle on the right, with mild associated edema.  Over the past week the pain has become more focally located on the lateral distal upper leg.  The pain is worse with palpation, worse with motion.  There is associated swelling in the area of focal pain.  She denies distal dysesthesia or weakness.  The patient adds that over the past days she has developed mild dyspnea.  There are no clear alleviating or exacerbating factors for this.  There is no associated chest pain. The patient does not smoke, does not take birth control. She has a distant history of DVT, about the time she had an extremity fracture.  She is not currently on anticoagulants.  Past Medical History  Diagnosis Date  . DVT (deep venous thrombosis)   . GERD (gastroesophageal reflux disease)   . Hemorrhoids     Past Surgical History  Procedure Date  . Tubal ligation     No family history on file.  History  Substance Use Topics  . Smoking status: Never Smoker   . Smokeless tobacco: Not on file  . Alcohol Use: Yes     Comment: 2 glasses of wine per week    OB History    Grav Para Term Preterm Abortions TAB SAB Ect Mult Living   2 2              Review of Systems  Constitutional:       Per HPI, otherwise negative  HENT:       Per HPI, otherwise negative  Eyes: Negative.   Respiratory:       Per HPI, otherwise negative  Cardiovascular:       Per HPI, otherwise negative  Gastrointestinal: Negative for vomiting.  Genitourinary: Negative.   Musculoskeletal:       Per HPI, otherwise negative  Skin: Negative.   Neurological: Negative for  syncope.    Allergies  Review of patient's allergies indicates no known allergies.  Home Medications  No current outpatient prescriptions on file.  BP 157/98  Pulse 86  Temp 98.9 F (37.2 C) (Oral)  Resp 16  SpO2 99%  LMP 06/06/2012  Physical Exam  Nursing note and vitals reviewed. Constitutional: She is oriented to person, place, and time. She appears well-developed and well-nourished. No distress.  HENT:  Head: Normocephalic and atraumatic.  Eyes: Conjunctivae normal and EOM are normal.  Cardiovascular: Normal rate and regular rhythm.   Pulmonary/Chest: Effort normal and breath sounds normal. No stridor. No respiratory distress.  Abdominal: She exhibits no distension.  Musculoskeletal: She exhibits no edema.       Legs:      Range of motion and strength is appropriate throughout the lower extremities. Distal Refill and pulses are appropriate bilaterally   Neurological: She is alert and oriented to person, place, and time. No cranial nerve deficit.  Skin: Skin is warm and dry.  Psychiatric: She has a normal mood and affect.    ED Course  Procedures (including critical care time)  Labs Reviewed  CBC WITH DIFFERENTIAL - Abnormal; Notable for the following:    WBC 10.8 (*)  MCV 76.9 (*)     MCH 25.6 (*)     All other components within normal limits  D-DIMER, QUANTITATIVE   Dg Chest 2 View  06/20/2012  *RADIOLOGY REPORT*  Clinical Data: Right lower extremity pain and history of DVT.  CHEST - 2 VIEW  Comparison: 12/20/2008  Findings: The lungs are clear and show normal volumes.  No edema, infiltrate or nodules identified. The bony thorax is unremarkable. The heart size is within normal limits.  IMPRESSION: No active disease.   Original Report Authenticated By: Irish Lack, M.D.      No diagnosis found.   Pulse ox 99% room air normal   Date: 06/20/2012  Rate: 78  Rhythm: normal sinus rhythm  QRS Axis: normal  Intervals: normal  ST/T Wave abnormalities:  normal  Conduction Disutrbances: none  Narrative Interpretation: unremarkable      MDM  This young female with a distant history of DVT, now presents with concerns of right lower extremity discomfort.  Notably, the patient also complains of new dyspnea.  On exam the patient is in no distress, breathing easily, not tachypneic, not tachycardic.  She is also hypoxic. Though she has a risk factor of prior DVT, this occurred in the setting of extremity trauma, and absent current risk factors for DVT, vital signs suggestive of this, but she is low risk.  D-dimer was negative.  The patient's labs were unremarkable.  Physical exam demonstrates an area of venous stasis.  Absent concerning findings, the patient was discharged with analgesics, PMD followup.      Gerhard Munch, MD 06/20/12 1107  Gerhard Munch, MD 06/20/12 [phone]

## 2012-06-20 NOTE — ED Notes (Signed)
Pt reports pain to right leg x 1 week. Hx of blood clots in left leg. Denies known injury. Reports shortness of breath x 3-4 days.

## 2012-10-27 ENCOUNTER — Other Ambulatory Visit (HOSPITAL_COMMUNITY)
Admission: RE | Admit: 2012-10-27 | Discharge: 2012-10-27 | Disposition: A | Discharge status: Home / Self Care | Payer: 59 | Source: Ambulatory Visit | Attending: Family Medicine | Admitting: Family Medicine

## 2012-10-27 ENCOUNTER — Ambulatory Visit (INDEPENDENT_AMBULATORY_CARE_PROVIDER_SITE_OTHER): Payer: 59 | Admitting: Family Medicine

## 2012-10-27 ENCOUNTER — Encounter: Payer: Self-pay | Admitting: Family Medicine

## 2012-10-27 VITALS — BP 118/80 | HR 81 | Temp 98.7°F | Ht 65.75 in | Wt 234.4 lb

## 2012-10-27 DIAGNOSIS — Z01419 Encounter for gynecological examination (general) (routine) without abnormal findings: Secondary | ICD-10-CM | POA: Insufficient documentation

## 2012-10-27 DIAGNOSIS — Z124 Encounter for screening for malignant neoplasm of cervix: Secondary | ICD-10-CM | POA: Insufficient documentation

## 2012-10-27 NOTE — Patient Instructions (Addendum)
Follow up in 1 year or as needed We'll notify you of your lab results and make any changes if needed Try and make any healthy food choices and get regular exercise Call with any questions or concerns Have a great summer!

## 2012-10-27 NOTE — Progress Notes (Signed)
  Subjective:    Patient ID: Cassie Rich, female    DOB: 24-Aug-1975, 37 y.o.   MRN: [id number]  HPI CPE- due for pap.  No concerns.   Review of Systems Patient reports no vision/ hearing changes, adenopathy,fever, weight change,  persistant/recurrent hoarseness , swallowing issues, chest pain, palpitations, edema, persistant/recurrent cough, hemoptysis, dyspnea (rest/exertional/paroxysmal nocturnal), gastrointestinal bleeding (melena, rectal bleeding), abdominal pain, significant heartburn, bowel changes, GU symptoms (dysuria, hematuria, incontinence), Gyn symptoms (abnormal  bleeding, pain),  syncope, focal weakness, memory loss, numbness & tingling, skin/hair/nail changes, abnormal bruising or bleeding, anxiety, or depression.     Objective:   Physical Exam  General Appearance:    Alert, cooperative, no distress, appears stated age, obese  Head:    Normocephalic, without obvious abnormality, atraumatic  Eyes:    PERRL, conjunctiva/corneas clear, EOM's intact, fundi    benign, both eyes  Ears:    Normal TM's and external ear canals, both ears  Nose:   Nares normal, septum midline, mucosa normal, no drainage    or sinus tenderness  Throat:   Lips, mucosa, and tongue normal; teeth and gums normal  Neck:   Supple, symmetrical, trachea midline, no adenopathy;    Thyroid: no enlargement/tenderness/nodules  Back:     Symmetric, no curvature, ROM normal, no CVA tenderness  Lungs:     Clear to auscultation bilaterally, respirations unlabored  Chest Wall:    No tenderness or deformity   Heart:    Regular rate and rhythm, S1 and S2 normal, no murmur, rub   or gallop  Breast Exam:    No tenderness, masses, or nipple abnormality  Abdomen:     Soft, non-tender, bowel sounds active all four quadrants,    no masses, no organomegaly  Genitalia:    External genitalia normal, cervix normal in appearance, no CMT, uterus in normal size and position, adnexa w/out mass or tenderness, mucosa pink and  moist, no lesions or discharge present  Rectal:    Normal external appearance  Extremities:   Extremities normal, atraumatic, no cyanosis or edema  Pulses:   2+ and symmetric all extremities  Skin:   Skin color, texture, turgor normal, no rashes or lesions  Lymph nodes:   Cervical, supraclavicular, and axillary nodes normal  Neurologic:   CNII-XII intact, normal strength, sensation and reflexes    throughout          Assessment & Plan:

## 2012-10-27 NOTE — Assessment & Plan Note (Signed)
Pt's PE WNL w/ exception of obesity.  Pap collected.  Check labs.  Anticipatory guidance provided.

## 2012-10-27 NOTE — Assessment & Plan Note (Signed)
Pap collected. 

## 2012-10-28 LAB — CBC WITH DIFFERENTIAL/PLATELET
Basophils Relative: 0.3 % (ref 0.0–3.0)
Eosinophils Relative: 0.6 % (ref 0.0–5.0)
Lymphocytes Relative: 25 % (ref 12.0–46.0)
Neutrophils Relative %: 69.2 % (ref 43.0–77.0)
Platelets: 358 10*3/uL (ref 150.0–400.0)
RBC: 4.96 Mil/uL (ref 3.87–5.11)
WBC: 10.9 10*3/uL — ABNORMAL HIGH (ref 4.5–10.5)

## 2012-10-28 LAB — HEPATIC FUNCTION PANEL
ALT: 21 U/L (ref 0–35)
AST: 26 U/L (ref 0–37)
Albumin: 3.9 g/dL (ref 3.5–5.2)
Alkaline Phosphatase: 63 U/L (ref 39–117)
Total Bilirubin: 0.6 mg/dL (ref 0.3–1.2)

## 2012-10-28 LAB — LIPID PANEL
HDL: 37.5 mg/dL — ABNORMAL LOW (ref >39.00)
Total CHOL/HDL Ratio: 4
VLDL: 15.4 mg/dL (ref 0.0–40.0)

## 2012-10-28 LAB — TSH: TSH: 1.49 u[IU]/mL (ref 0.35–5.50)

## 2012-10-28 LAB — BASIC METABOLIC PANEL
BUN: 7 mg/dL (ref 6–23)
Chloride: 107 mEq/L (ref 96–112)
GFR: 102.24 mL/min (ref >60.00)
Glucose, Bld: 125 mg/dL — ABNORMAL HIGH (ref 70–99)
Potassium: 3.4 mEq/L — ABNORMAL LOW (ref 3.5–5.1)
Sodium: 137 mEq/L (ref 135–145)

## 2012-10-31 LAB — VITAMIN D 1,25 DIHYDROXY
Vitamin D 1, 25 (OH)2 Total: 87 pg/mL — ABNORMAL HIGH (ref 18–72)
Vitamin D3 1, 25 (OH)2: 87 pg/mL

## 2012-11-03 ENCOUNTER — Encounter: Payer: Self-pay | Admitting: *Deleted

## 2013-12-18 ENCOUNTER — Ambulatory Visit (INDEPENDENT_AMBULATORY_CARE_PROVIDER_SITE_OTHER): Payer: 59 | Admitting: Family Medicine

## 2013-12-18 ENCOUNTER — Other Ambulatory Visit: Payer: Self-pay | Admitting: Family Medicine

## 2013-12-18 ENCOUNTER — Encounter: Payer: Self-pay | Admitting: Family Medicine

## 2013-12-18 VITALS — BP 122/82 | HR 77 | Temp 98.3°F | Resp 16 | Wt 231.4 lb

## 2013-12-18 DIAGNOSIS — R7309 Other abnormal glucose: Secondary | ICD-10-CM | POA: Insufficient documentation

## 2013-12-18 DIAGNOSIS — E669 Obesity, unspecified: Secondary | ICD-10-CM

## 2013-12-18 DIAGNOSIS — B353 Tinea pedis: Secondary | ICD-10-CM

## 2013-12-18 DIAGNOSIS — D72829 Elevated white blood cell count, unspecified: Secondary | ICD-10-CM

## 2013-12-18 LAB — HEPATIC FUNCTION PANEL
ALBUMIN: 4 g/dL (ref 3.5–5.2)
ALT: 18 U/L (ref 0–35)
AST: 23 U/L (ref 0–37)
Alkaline Phosphatase: 64 U/L (ref 39–117)
Bilirubin, Direct: 0.2 mg/dL (ref 0.0–0.3)
Total Bilirubin: 0.8 mg/dL (ref 0.2–1.2)
Total Protein: 7.3 g/dL (ref 6.0–8.3)

## 2013-12-18 LAB — TSH: TSH: 0.86 u[IU]/mL (ref 0.35–4.50)

## 2013-12-18 LAB — CBC WITH DIFFERENTIAL/PLATELET
Basophils Absolute: 0.1 10*3/uL (ref 0.0–0.1)
Basophils Relative: 0.5 % (ref 0.0–3.0)
EOS PCT: 0.6 % (ref 0.0–5.0)
Eosinophils Absolute: 0.1 10*3/uL (ref 0.0–0.7)
HCT: 39.2 % (ref 36.0–46.0)
Hemoglobin: 12.6 g/dL (ref 12.0–15.0)
Lymphocytes Relative: 26.5 % (ref 12.0–46.0)
Lymphs Abs: 3 10*3/uL (ref 0.7–4.0)
MCHC: 32.2 g/dL (ref 30.0–36.0)
MCV: 78 fl (ref 78.0–100.0)
MONO ABS: 0.5 10*3/uL (ref 0.1–1.0)
MONOS PCT: 4.3 % (ref 3.0–12.0)
NEUTROS PCT: 68.1 % (ref 43.0–77.0)
Neutro Abs: 7.8 10*3/uL — ABNORMAL HIGH (ref 1.4–7.7)
PLATELETS: 390 10*3/uL (ref 150.0–400.0)
RBC: 5.03 Mil/uL (ref 3.87–5.11)
RDW: 15.8 % — ABNORMAL HIGH (ref 11.5–15.5)
WBC: 11.4 10*3/uL — AB (ref 4.0–10.5)

## 2013-12-18 LAB — BASIC METABOLIC PANEL
BUN: 9 mg/dL (ref 6–23)
CHLORIDE: 109 meq/L (ref 96–112)
CO2: 25 meq/L (ref 19–32)
CREATININE: 0.8 mg/dL (ref 0.4–1.2)
Calcium: 8.8 mg/dL (ref 8.4–10.5)
GFR: 104.59 mL/min (ref >60.00)
Glucose, Bld: 117 mg/dL — ABNORMAL HIGH (ref 70–99)
Potassium: 4.2 mEq/L (ref 3.5–5.1)
Sodium: 137 mEq/L (ref 135–145)

## 2013-12-18 LAB — HEMOGLOBIN A1C: HEMOGLOBIN A1C: 5.4 % (ref 4.6–6.5)

## 2013-12-18 LAB — LIPID PANEL
Cholesterol: 142 mg/dL (ref 0–200)
HDL: 34.4 mg/dL — AB (ref >39.00)
LDL Cholesterol: 97 mg/dL (ref 0–99)
NonHDL: 107.6
TRIGLYCERIDES: 55 mg/dL (ref 0.0–149.0)
Total CHOL/HDL Ratio: 4
VLDL: 11 mg/dL (ref 0.0–40.0)

## 2013-12-18 MED ORDER — CLOTRIMAZOLE-BETAMETHASONE 1-0.05 % EX CREA: 1.0000 "application " | TOPICAL_CREAM | Freq: Two times a day (BID) | CUTANEOUS | Status: DC

## 2013-12-18 NOTE — Patient Instructions (Signed)
Schedule your complete physical in 6 months We'll notify you of your lab results and make any changes if needed Try and make healthy food choices (low carb!) and get regular exercise Use the Lotrisone cream twice daily between the toes Call with any questions or concerns Hang in there!

## 2013-12-18 NOTE — Progress Notes (Signed)
   Subjective:    Patient ID: Cassie Rich, female    DOB: [birth year], 38 y.o.   MRN: [id number]  HPI Elevated sugar- pt had screening done at work and was told that sugar was elevated (value unknown).  Was fasting at the time.  Has had recent fatigue.  Denies increased thirst, urination.  No unexplained weight loss.  + family hx.  Obesity- ongoing issue.  Not exercising, not following any particular diet.  No CP, SOB, HAs, visual changes, edema.  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 Constitutional: She is oriented to person, place, and time. She appears well-developed and well-nourished. No distress.  obese  HENT:  Head: Normocephalic and atraumatic.  Eyes: Conjunctivae and EOM are normal. Pupils are equal, round, and reactive to light.  Neck: Normal range of motion. Neck supple. No thyromegaly present.  Cardiovascular: Normal rate, regular rhythm, normal heart sounds and intact distal pulses.   No murmur heard. Pulmonary/Chest: Effort normal and breath sounds normal. No respiratory distress.  Abdominal: Soft. She exhibits no distension. There is no tenderness.  Musculoskeletal: She exhibits no edema.  Lymphadenopathy:    She has no cervical adenopathy.  Neurological: She is alert and oriented to person, place, and time.  Skin: Skin is warm.  Wet, interdigital fungal infxn on L foot  Psychiatric: She has a normal mood and affect. Her behavior is normal.          Assessment & Plan:

## 2013-12-18 NOTE — Progress Notes (Signed)
Pre visit review using our clinic review tool, if applicable. No additional management support is needed unless otherwise documented below in the visit note. 

## 2013-12-19 NOTE — Assessment & Plan Note (Signed)
New.  Pt continues to gain weight.  Not exercising, not following particularly diet.  Stressed need to focus on weight loss to minimize health risks moving forward.  Reviewed need for at least 30 min of activity for 4 or more days/week.  Also reviewed low carb diets.  Will follow.

## 2013-12-19 NOTE — Assessment & Plan Note (Signed)
New.  Start Lotrisone cream twice daily.  Reviewed supportive care and red flags that should prompt return.  Pt expressed understanding and is in agreement w/ plan.

## 2013-12-19 NOTE — Assessment & Plan Note (Signed)
New.  Noted on labs done at work.  Repeat labs and check A1C to r/o diabetes in setting of obesity.

## 2014-01-08 ENCOUNTER — Encounter (HOSPITAL_COMMUNITY): Payer: Self-pay | Admitting: Emergency Medicine

## 2014-01-08 ENCOUNTER — Emergency Department (HOSPITAL_COMMUNITY): Payer: 59

## 2014-01-08 ENCOUNTER — Emergency Department (HOSPITAL_COMMUNITY)
Admission: EM | Admit: 2014-01-08 | Discharge: 2014-01-08 | Disposition: A | Discharge status: Home / Self Care | Payer: 59 | Attending: Emergency Medicine | Admitting: Emergency Medicine

## 2014-01-08 DIAGNOSIS — Z86718 Personal history of other venous thrombosis and embolism: Secondary | ICD-10-CM | POA: Diagnosis not present

## 2014-01-08 DIAGNOSIS — Z8719 Personal history of other diseases of the digestive system: Secondary | ICD-10-CM | POA: Diagnosis not present

## 2014-01-08 DIAGNOSIS — R0602 Shortness of breath: Secondary | ICD-10-CM

## 2014-01-08 DIAGNOSIS — Z79899 Other long term (current) drug therapy: Secondary | ICD-10-CM | POA: Diagnosis not present

## 2014-01-08 DIAGNOSIS — Z3202 Encounter for pregnancy test, result negative: Secondary | ICD-10-CM | POA: Diagnosis not present

## 2014-01-08 DIAGNOSIS — M79605 Pain in left leg: Secondary | ICD-10-CM

## 2014-01-08 DIAGNOSIS — R0609 Other forms of dyspnea: Secondary | ICD-10-CM | POA: Insufficient documentation

## 2014-01-08 DIAGNOSIS — R0600 Dyspnea, unspecified: Secondary | ICD-10-CM

## 2014-01-08 DIAGNOSIS — R0989 Other specified symptoms and signs involving the circulatory and respiratory systems: Secondary | ICD-10-CM | POA: Diagnosis not present

## 2014-01-08 DIAGNOSIS — M79609 Pain in unspecified limb: Secondary | ICD-10-CM | POA: Diagnosis present

## 2014-01-08 LAB — CBC WITH DIFFERENTIAL/PLATELET
BASOS PCT: 0 % (ref 0–1)
Basophils Absolute: 0 10*3/uL (ref 0.0–0.1)
EOS ABS: 0.1 10*3/uL (ref 0.0–0.7)
EOS PCT: 1 % (ref 0–5)
HEMATOCRIT: 40.1 % (ref 36.0–46.0)
Hemoglobin: 13.4 g/dL (ref 12.0–15.0)
Lymphocytes Relative: 28 % (ref 12–46)
Lymphs Abs: 3.6 10*3/uL (ref 0.7–4.0)
MCH: 25.3 pg — AB (ref 26.0–34.0)
MCHC: 33.4 g/dL (ref 30.0–36.0)
MCV: 75.8 fL — ABNORMAL LOW (ref 78.0–100.0)
MONOS PCT: 6 % (ref 3–12)
Monocytes Absolute: 0.7 10*3/uL (ref 0.1–1.0)
Neutro Abs: 8.5 10*3/uL — ABNORMAL HIGH (ref 1.7–7.7)
Neutrophils Relative %: 65 % (ref 43–77)
Platelets: 382 10*3/uL (ref 150–400)
RBC: 5.29 MIL/uL — ABNORMAL HIGH (ref 3.87–5.11)
RDW: 14.9 % (ref 11.5–15.5)
WBC: 12.9 10*3/uL — ABNORMAL HIGH (ref 4.0–10.5)

## 2014-01-08 LAB — BASIC METABOLIC PANEL
Anion gap: 14 (ref 5–15)
BUN: 9 mg/dL (ref 6–23)
CHLORIDE: 102 meq/L (ref 96–112)
CO2: 23 mEq/L (ref 19–32)
Calcium: 9.5 mg/dL (ref 8.4–10.5)
Creatinine, Ser: 0.84 mg/dL (ref 0.50–1.10)
GFR calc Af Amer: >90 mL/min (ref >90)
GFR, EST NON AFRICAN AMERICAN: 87 mL/min — AB (ref >90)
Glucose, Bld: 124 mg/dL — ABNORMAL HIGH (ref 70–99)
Potassium: 4.2 mEq/L (ref 3.7–5.3)
Sodium: 139 mEq/L (ref 137–147)

## 2014-01-08 LAB — POC URINE PREG, ED: Preg Test, Ur: NEGATIVE

## 2014-01-08 LAB — I-STAT TROPONIN, ED: Troponin i, poc: 0 ng/mL (ref 0.00–0.08)

## 2014-01-08 MED ORDER — MORPHINE SULFATE 4 MG/ML IJ SOLN
4.0000 mg | Freq: Once | INTRAMUSCULAR | Status: AC
  Administered 2014-01-08: 4 mg via INTRAVENOUS
  Filled 2014-01-08: qty 1

## 2014-01-08 MED ORDER — IOHEXOL 350 MG/ML SOLN
100.0000 mL | Freq: Once | INTRAVENOUS | Status: AC | PRN
  Administered 2014-01-08: 100 mL via INTRAVENOUS

## 2014-01-08 NOTE — ED Notes (Signed)
Vascular US at bedside.

## 2014-01-08 NOTE — Progress Notes (Signed)
*  Preliminary Results* Left lower extremity venous duplex completed. Left lower extremity is negative for deep vein thrombosis. There is no evidence of left Baker's cyst.  01/08/2014 11:59 AM  Maudry Mayhew, RVT, RDCS, RDMS

## 2014-01-08 NOTE — ED Notes (Signed)
Pt has some shortness of breath that started last week. Denies chest pain at this time. VS stable. Denies productive cough. Pt has had leg pain lasting a month. Localized to the left lower leg.Tenderness and heat to posterior leg.   Hx of blood clot in 2010.

## 2014-01-08 NOTE — Discharge Instructions (Signed)
Your blood work today was normal including your blood counts.  There is no sign of damage to your heart based on your blood work or EKG. Your ultrasound of your leg did not show a DVT. PCP of your chest did not show pneumonia, pulmonary edema (fluid on your lungs), or pulmonary embolus (blood clot). Please followup with your primary care physician for further evaluation. I doubt there is any life-threatening illness present.   Shortness of Breath Shortness of breath means you have trouble breathing. It could also mean that you have a medical problem. You should get immediate medical care for shortness of breath. CAUSES   Not enough oxygen in the air such as with high altitudes or a smoke-filled room.  Certain lung diseases, infections, or problems.  Heart disease or conditions, such as angina or heart failure.  Low red blood cells (anemia).  Poor physical fitness, which can cause shortness of breath when you exercise.  Chest or back injuries or stiffness.  Being overweight.  Smoking.  Anxiety, which can make you feel like you are not getting enough air. DIAGNOSIS  Serious medical problems can often be found during your physical exam. Tests may also be done to determine why you are having shortness of breath. Tests may include:  Chest X-rays.  Lung function tests.  Blood tests.  An electrocardiogram (ECG).  An ambulatory electrocardiogram. An ambulatory ECG records your heartbeat patterns over a 24-hour period.  Exercise testing.  A transthoracic echocardiogram (TTE). During echocardiography, sound waves are used to evaluate how blood flows through your heart.  A transesophageal echocardiogram (TEE).  Imaging scans. Your health care provider may not be able to find a cause for your shortness of breath after your exam. In this case, it is important to have a follow-up exam with your health care provider as directed.  TREATMENT  Treatment for shortness of breath depends on  the cause of your symptoms and can vary greatly. HOME CARE INSTRUCTIONS   Do not smoke. Smoking is a common cause of shortness of breath. If you smoke, ask for help to quit.  Avoid being around chemicals or things that may bother your breathing, such as paint fumes and dust.  Rest as needed. Slowly resume your usual activities.  If medicines were prescribed, take them as directed for the full length of time directed. This includes oxygen and any inhaled medicines.  Keep all follow-up appointments as directed by your health care provider. SEEK MEDICAL CARE IF:   Your condition does not improve in the time expected.  You have a hard time doing your normal activities even with rest.  You have any new symptoms. SEEK IMMEDIATE MEDICAL CARE IF:   Your shortness of breath gets worse.  You feel light-headed, faint, or develop a cough not controlled with medicines.  You start coughing up blood.  You have pain with breathing.  You have chest pain or pain in your arms, shoulders, or abdomen.  You have a fever.  You are unable to walk up stairs or exercise the way you normally do. MAKE SURE YOU:  Understand these instructions.  Will watch your condition.  Will get help right away if you are not doing well or get worse. Document Released: 02/10/2001 Document Revised: 05/23/2013 Document Reviewed: 08/03/2011 [facility] Patient Information 2015 [place], [place]. This information is not intended to replace advice given to you by your health care provider. Make sure you discuss any questions you have with your health care provider.

## 2014-01-08 NOTE — ED Provider Notes (Signed)
TIME SEEN: 9:14 AM  CHIEF COMPLAINT: Left leg pain for 3 weeks, shortness of breath for one week  HPI: Patient is a 38 y.o. F with history of prior right lower extremity DVT and she is content after an ankle fracture who is no longer on anticoagulation, GERD who presents to the emergency department with complaints of 3 weeks of tenderness in the left posterior calf that was similar to her prior DVT. She states that she sits for extended periods of time at her job. She is worried she may have a new DVT. She states for the past week she is also had shortness of breath. Denies any aggravating or relieving factors. No chest pain or chest discomfort. No fever. She has had a dry cough. No history of injury to her leg. She denies a history of hypertension, diabetes, hyperlipidemia or tobacco use. She does have a family history of cardiac disease, her father had a stent in his 51s. She denies that she's had any recent prolonged immobilization such as long flight or hospitalization, recent fracture, trauma, surgery, exogenous hormone use.  ROS: See HPI Constitutional: no fever  Eyes: no drainage  ENT: no runny nose   Cardiovascular:  no chest pain  Resp:  SOB  GI: no vomiting GU: no dysuria Integumentary: no rash  Allergy: no hives  Musculoskeletal: no leg swelling  Neurological: no slurred speech ROS otherwise negative  PAST MEDICAL HISTORY/PAST SURGICAL HISTORY:  Past Medical History  Diagnosis Date  . DVT (deep venous thrombosis)   . GERD (gastroesophageal reflux disease)   . Hemorrhoids     MEDICATIONS:  Prior to Admission medications   Medication Sig Start Date End Date Taking? Authorizing Provider  clotrimazole-betamethasone (LOTRISONE) cream Apply 1 application topically 2 (two) times daily. 12/18/13   Midge Minium, MD    ALLERGIES:  No Known Allergies  SOCIAL HISTORY:  History  Substance Use Topics  . Smoking status: Never Smoker   . Smokeless tobacco: Not on file  .  Alcohol Use: Yes     Comment: 2 glasses of wine per week and socially    FAMILY HISTORY: History reviewed. No pertinent family history.  EXAM: BP 145/93  Pulse 91  Temp(Src) 98.4 F (36.9 C) (Oral)  Resp 20  Ht 5\' 5"  (1.651 m)  Wt 230 lb (104.327 kg)  BMI 38.27 kg/m2  SpO2 99%  LMP 12/13/2013 CONSTITUTIONAL: Alert and oriented and responds appropriately to questions. Well-appearing; well-nourished HEAD: Normocephalic EYES: Conjunctivae clear, PERRL ENT: normal nose; no rhinorrhea; moist mucous membranes; pharynx without lesions noted NECK: Supple, no meningismus, no LAD  CARD: RRR; S1 and S2 appreciated; no murmurs, no clicks, no rubs, no gallops RESP: Normal chest excursion without splinting or tachypnea; breath sounds clear and equal bilaterally; no wheezes, no rhonchi, no rales,  ABD/GI: Normal bowel sounds; non-distended; soft, non-tender, no rebound, no guarding BACK:  The back appears normal and is non-tender to palpation, there is no CVA tenderness EXT: Tender to palpation in the posterior left calf diffusely without erythema or warmth, soft compartments, sensation to light touch intact diffusely, no bony deformity or ecchymosis, no edema, 2+ pulses bilaterally, no joint effusion, Normal ROM in all joints; otherwise extremities are non-tender to palpation; no edema; normal capillary refill; no cyanosis    SKIN: Normal color for age and race; warm NEURO: Moves all extremities equally PSYCH: The patient's mood and manner are appropriate. Grooming and personal hygiene are appropriate.  MEDICAL DECISION MAKING: Patient here with left  lower extremity pain for 3 weeks and no shortness of breath for the past week. She is hemodynamically stable and in no respiratory distress. Given her history of prior DVT, concern for possible DVT or PE today. Will obtain labs including troponin, EKG, venous Doppler of her left lower extremity, CT imaging of her chest to rule out DVT and PE.  ED  PROGRESS: Labs are unremarkable other than mild leukocytosis which has been chronic for patient since 2011. Troponin negative. EKG shows no ischemic changes or arrhythmia. Venous Doppler of her left lower extremity a CT of her chest pending.    11:34 AM  Pt's CT of her chest shows no infiltrate, edema, pneumothorax or pulmonary embolus. Also no dissection. Doppler is also negative for DVT, superficial femoral phlebitis, Baker cyst. Confirmed this with vascular technician. Unclear cause for patient's symptoms although I do not think there is anything life-threatening today. I feel she is safe to be discharged home. She is low risk factors for ACS and she has had a negative troponin despite having symptoms for a week. Have recommended outpatient followup. Discussed return precautions and supportive care instructions. She and family at bedside verbalize understanding and are comfortable with this plan.   EKG Interpretation  Date/Time:  Monday January 08 2014 09:40:27 EDT Ventricular Rate:  75 PR Interval:  164 QRS Duration: 84 QT Interval:  393 QTC Calculation: 439 R Axis:   31 Text Interpretation:  Sinus rhythm Confirmed by Shaune Malacara,  DO, Thomasena Vandenheuvel ([id number]) on 01/08/2014 9:44:58 AM Also confirmed by Shailee Foots,  DO, Naelle Diegel ([id number])  on 01/08/2014 9:45:16 AM         WBC  Date Value Ref Range Status  01/08/2014 12.9* 4.0 - 10.5 K/uL Final  12/18/2013 11.4* 4.0 - 10.5 K/uL Final  10/27/2012 10.9* 4.5 - 10.5 K/uL Final  06/20/2012 10.8* 4.0 - 10.5 K/uL Final     Tamitha Norell N Yaron Grasse, DO 01/08/14 1135

## 2014-02-13 ENCOUNTER — Telehealth: Payer: Self-pay | Admitting: Hematology & Oncology

## 2014-02-13 NOTE — Telephone Encounter (Signed)
I spoke w NEW PATIENT today to remind them of their appointment with Dr. Marin Olp. Also, advised them to bring all medication bottles and insurance card information.  However, pt advised she is not on any meds at this time.

## 2014-02-14 ENCOUNTER — Other Ambulatory Visit (HOSPITAL_BASED_OUTPATIENT_CLINIC_OR_DEPARTMENT_OTHER): Payer: 59 | Admitting: Lab

## 2014-02-14 ENCOUNTER — Encounter: Payer: Self-pay | Admitting: Family

## 2014-02-14 ENCOUNTER — Ambulatory Visit: Payer: 59

## 2014-02-14 ENCOUNTER — Ambulatory Visit (HOSPITAL_BASED_OUTPATIENT_CLINIC_OR_DEPARTMENT_OTHER): Payer: 59 | Admitting: Family

## 2014-02-14 VITALS — BP 130/82 | HR 94 | Temp 98.6°F | Ht 66.0 in | Wt 231.0 lb

## 2014-02-14 DIAGNOSIS — D72829 Elevated white blood cell count, unspecified: Secondary | ICD-10-CM

## 2014-02-14 DIAGNOSIS — D509 Iron deficiency anemia, unspecified: Secondary | ICD-10-CM

## 2014-02-14 LAB — CBC WITH DIFFERENTIAL (CANCER CENTER ONLY)
BASO#: 0 10*3/uL (ref 0.0–0.2)
BASO%: 0.3 % (ref 0.0–2.0)
EOS%: 0.4 % (ref 0.0–7.0)
Eosinophils Absolute: 0.1 10*3/uL (ref 0.0–0.5)
HEMATOCRIT: 36.4 % (ref 34.8–46.6)
HEMOGLOBIN: 12.4 g/dL (ref 11.6–15.9)
LYMPH#: 3.3 10*3/uL (ref 0.9–3.3)
LYMPH%: 28.1 % (ref 14.0–48.0)
MCH: 25.7 pg — ABNORMAL LOW (ref 26.0–34.0)
MCHC: 34.1 g/dL (ref 32.0–36.0)
MCV: 75 fL — ABNORMAL LOW (ref 81–101)
MONO#: 0.6 10*3/uL (ref 0.1–0.9)
MONO%: 4.9 % (ref 0.0–13.0)
NEUT#: 7.9 10*3/uL — ABNORMAL HIGH (ref 1.5–6.5)
NEUT%: 66.3 % (ref 39.6–80.0)
Platelets: 420 10*3/uL — ABNORMAL HIGH (ref 145–400)
RBC: 4.83 10*6/uL (ref 3.70–5.32)
RDW: 15 % (ref 11.1–15.7)
WBC: 11.8 10*3/uL — ABNORMAL HIGH (ref 3.9–10.0)

## 2014-02-14 LAB — CHCC SATELLITE - SMEAR

## 2014-02-15 NOTE — Progress Notes (Signed)
Hematology/Oncology Consultation   Name: Cassie Rich      MRN: [id number]    Location: Room/bed info not found  Date: 02/15/2014 Time:11:00 PM   REFERRING PHYSICIAN: [name]  REASON FOR CONSULT:  Elevated WBC   DIAGNOSIS:   Elevated WBC Sickle cell trait  HISTORY OF PRESENT ILLNESS:  Cassie Rich is a very pleasant 38 yo African American female. She has had a history of elevated WBC counts (10.8-12.9) for the last 3 years. She does carry the sickle cell trait as does her daughter. She is not on folic acid. No one in her family actually has sickle cell. In 2008, she broke her right ankle and ended up with a DVT. She was treated with Lovenox and then Coumadin for a few months. She has no other issues with this since then. She has no problems with her thyroid. She has had no miscarriages. She does not smoke or drink ETOH. She craves and chew a lot of ice. She feels tired all the time and cold. She has no personal or familial history of cancer. She denies fever, n/v, cough, rash, mouth sores, headache, dizziness, SOB, chest pain, palpitations, abdominal pain, constipation, diarrhea, blood in urine or stool. She has had no bleeding or pain. She denies swelling, tenderness, numbness or tingling in her extremities. Her appetite is good and she is drinking plenty of fluids. 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     ROS: All other 10 point review of systems is negative except for those issues mentioned above.   PAST MEDICAL HISTORY:   Past Medical History  Diagnosis Date  . DVT (deep venous thrombosis)   . GERD (gastroesophageal reflux disease)   . Hemorrhoids    ALLERGIES: No Known Allergies    MEDICATIONS:  No current outpatient prescriptions on file prior to visit.   No current facility-administered medications on file prior to visit.    PAST SURGICAL HISTORY Past Surgical History  Procedure Laterality Date  . Tubal ligation      FAMILY HISTORY: No family history on file.  SOCIAL HISTORY:  reports that she has never smoked. She does not have any smokeless tobacco history on file. She reports that she drinks alcohol. She reports that she does not use illicit drugs.  PERFORMANCE STATUS: The patient's performance status is 0 - Asymptomatic  PHYSICAL EXAM: Most Recent Vital Signs: Blood pressure 130/82, pulse 94, temperature 98.6 F (37 C), height 5\' 6"  (1.676 m), weight 231 lb (104.781 kg). BP 130/82  Pulse 94  Temp(Src) 98.6 F (37 C)  Ht 5\' 6"  (1.676 m)  Wt 231 lb (104.781 kg)  BMI 37.30 kg/m2  General Appearance:    Alert, cooperative, no distress, appears stated age  Head:    Normocephalic, without obvious abnormality, atraumatic  Eyes:    PERRL, conjunctiva/corneas clear, EOM's intact, fundi    benign, both eyes        Throat:   Lips, mucosa, and tongue normal; teeth and gums normal  Neck:   Supple, symmetrical, trachea midline, no adenopathy;    thyroid:  no enlargement/tenderness/nodules; no carotid   bruit or JVD  Back:     Symmetric, no curvature, ROM normal, no CVA tenderness  Lungs:     Clear to auscultation bilaterally, respirations unlabored  Chest Wall:    No tenderness or deformity   Heart:    Regular rate and rhythm, S1 and S2 normal, no murmur, rub  or gallop     Abdomen:     Soft, non-tender, bowel sounds active all four quadrants,    no masses, no organomegaly        Extremities:   Extremities normal, atraumatic, no cyanosis or edema  Pulses:   2+ and symmetric all extremities  Skin:   Skin color, texture, turgor normal, no rashes or lesions  Lymph nodes:   Cervical, supraclavicular, and axillary nodes normal  Neurologic:   CNII-XII intact, normal strength, sensation and reflexes    throughout   LABORATORY DATA:  Results for orders placed in visit on 02/14/14 (from the past 48 hour(s))  CBC WITH DIFFERENTIAL ([place])     Status: Abnormal   Collection Time     02/14/14  1:11 PM      Result Value Ref Range   WBC 11.8 (*) 3.9 - 10.0 10e3/uL   RBC 4.83  3.70 - 5.32 10e6/uL   HGB 12.4  11.6 - 15.9 g/dL   HCT 36.4  34.8 - 46.6 %   MCV 75 (*) 81 - 101 fL   MCH 25.7 (*) 26.0 - 34.0 pg   MCHC 34.1  32.0 - 36.0 g/dL   RDW 15.0  11.1 - 15.7 %   Platelets 420 (*) 145 - 400 10e3/uL   NEUT# 7.9 (*) 1.5 - 6.5 10e3/uL   LYMPH# 3.3  0.9 - 3.3 10e3/uL   MONO# 0.6  0.1 - 0.9 10e3/uL   Eosinophils Absolute 0.1  0.0 - 0.5 10e3/uL   BASO# 0.0  0.0 - 0.2 10e3/uL   NEUT% 66.3  39.6 - 80.0 %   LYMPH% 28.1  14.0 - 48.0 %   MONO% 4.9  0.0 - 13.0 %   EOS% 0.4  0.0 - 7.0 %   BASO% 0.3  0.0 - 2.0 %  [facility] - SMEAR     Status: None   Collection Time    02/14/14  1:11 PM      Result Value Ref Range   Smear Result Smear Available       RADIOGRAPHY: No results found.     PATHOLOGY: None  ASSESSMENT/PLAN: Cassie Rich is a very pleasant 38 yo African American female. She has had a history of elevated WBC counts (10.8-12.9) for the last 3 years. This does not appear to be a She has the sickle cell trait.  Her MCV is 75 and she is symptomatic with fatigue, ice cravings and chills. Her smear is also indicative of possible iron deficiency anemia.  She will start taking oral iron daily.  She will also start taking folic acid daily.  We will see her again in 6 months for labs and follow-up.  All questions were answered. The patient knows to call the clinic with any problems, questions or concerns. We can certainly see the patient much sooner if necessary.  The patient was discussed with and also seen by Dr. Marin Olp and he is in agreement with the aforementioned.   [place] by Dr. Marin Olp: I agree with the above note. Her blood smear was unremarkable. The white cells did not appear to be immature. She normal red blood cells.  I agree with the folic acid for sickle cell trait. I think this will be helpful.  I believe a six-month  followup would be appropriate.

## 2014-03-06 ENCOUNTER — Encounter: Payer: Self-pay | Admitting: Family Medicine

## 2014-03-06 ENCOUNTER — Ambulatory Visit (INDEPENDENT_AMBULATORY_CARE_PROVIDER_SITE_OTHER): Payer: 59 | Admitting: Family Medicine

## 2014-03-06 VITALS — BP 128/84 | HR 70 | Temp 98.3°F | Resp 16 | Ht 66.5 in | Wt 233.2 lb

## 2014-03-06 DIAGNOSIS — Z01419 Encounter for gynecological examination (general) (routine) without abnormal findings: Secondary | ICD-10-CM

## 2014-03-06 NOTE — Progress Notes (Signed)
   Subjective:    Patient ID: Cassie Rich, female    DOB: 05-24-1976, 38 y.o.   MRN: [id number]  HPI CPE- UTD on pap.  Labs reviewed from July- no concerns   Review of Systems Patient reports no vision/ hearing changes, adenopathy,fever, weight change,  persistant/recurrent hoarseness , swallowing issues, chest pain, palpitations, edema, persistant/recurrent cough, hemoptysis, dyspnea (rest/exertional/paroxysmal nocturnal), gastrointestinal bleeding (melena, rectal bleeding), abdominal pain, significant heartburn, bowel changes, GU symptoms (dysuria, hematuria, incontinence), Gyn symptoms (abnormal  bleeding, pain),  syncope, focal weakness, memory loss, numbness & tingling, skin/hair/nail changes, abnormal bruising or bleeding, anxiety, or depression.     Objective:   Physical Exam  General Appearance:    Alert, cooperative, no distress, appears stated age  Head:    Normocephalic, without obvious abnormality, atraumatic  Eyes:    PERRL, conjunctiva/corneas clear, EOM's intact, fundi    benign, both eyes  Ears:    Normal TM's and external ear canals, both ears  Nose:   Nares normal, septum midline, mucosa normal, no drainage    or sinus tenderness  Throat:   Lips, mucosa, and tongue normal; teeth and gums normal  Neck:   Supple, symmetrical, trachea midline, no adenopathy;    Thyroid: no enlargement/tenderness/nodules  Back:     Symmetric, no curvature, ROM normal, no CVA tenderness  Lungs:     Clear to auscultation bilaterally, respirations unlabored  Chest Wall:    No tenderness or deformity   Heart:    Regular rate and rhythm, S1 and S2 normal, no murmur, rub   or gallop  Breast Exam:    No tenderness, masses, or nipple abnormality  Abdomen:     Soft, non-tender, bowel sounds active all four quadrants,    no masses, no organomegaly  Genitalia:    Deferred at pt request  Rectal:    Extremities:   Extremities normal, atraumatic, no cyanosis or edema  Pulses:   2+ and  symmetric all extremities  Skin:   Skin color, texture, turgor normal, no rashes or lesions  Lymph nodes:   Cervical, supraclavicular, and axillary nodes normal  Neurologic:   CNII-XII intact, normal strength, sensation and reflexes    throughout          Assessment & Plan:

## 2014-03-06 NOTE — Assessment & Plan Note (Signed)
Pt's PE WNL w/ exception of obesity.  Reviewed recent labs.  Pt too young to start mammograms.  UTD on pap.  Anticipatory guidance provided.

## 2014-03-06 NOTE — Patient Instructions (Signed)
Follow up in 6 months to recheck BP Keep up the good work on healthy diet and regular exercise Call with any questions or concerns Happy Fall!!!

## 2014-03-06 NOTE — Progress Notes (Signed)
Pre visit review using our clinic review tool, if applicable. No additional management support is needed unless otherwise documented below in the visit note. 

## 2014-04-02 ENCOUNTER — Encounter: Payer: Self-pay | Admitting: Family Medicine

## 2014-08-08 ENCOUNTER — Ambulatory Visit: Payer: Self-pay | Admitting: Medical

## 2014-08-10 ENCOUNTER — Ambulatory Visit: Payer: Self-pay | Admitting: Family Medicine

## 2014-08-10 ENCOUNTER — Ambulatory Visit (INDEPENDENT_AMBULATORY_CARE_PROVIDER_SITE_OTHER): Payer: 59 | Admitting: Family Medicine

## 2014-08-10 ENCOUNTER — Other Ambulatory Visit (HOSPITAL_COMMUNITY)
Admission: RE | Admit: 2014-08-10 | Discharge: 2014-08-10 | Disposition: A | Discharge status: Home / Self Care | Payer: 59 | Source: Ambulatory Visit | Attending: Family Medicine | Admitting: Family Medicine

## 2014-08-10 ENCOUNTER — Encounter: Payer: Self-pay | Admitting: Family Medicine

## 2014-08-10 VITALS — BP 120/78 | HR 75 | Temp 98.0°F | Resp 16 | Wt 236.0 lb

## 2014-08-10 DIAGNOSIS — N760 Acute vaginitis: Secondary | ICD-10-CM | POA: Diagnosis not present

## 2014-08-10 DIAGNOSIS — R319 Hematuria, unspecified: Secondary | ICD-10-CM

## 2014-08-10 DIAGNOSIS — R300 Dysuria: Secondary | ICD-10-CM

## 2014-08-10 LAB — POCT URINALYSIS DIPSTICK
BILIRUBIN UA: NEGATIVE
Blood, UA: 10
GLUCOSE UA: NEGATIVE
Ketones, UA: NEGATIVE
Leukocytes, UA: NEGATIVE
NITRITE UA: NEGATIVE
Protein, UA: 0.15
Spec Grav, UA: 1.025
UROBILINOGEN UA: 0.2
pH, UA: 6

## 2014-08-10 MED ORDER — CEPHALEXIN 500 MG PO CAPS: 500.0000 mg | ORAL_CAPSULE | Freq: Two times a day (BID) | ORAL | Status: AC

## 2014-08-10 NOTE — Patient Instructions (Signed)
Follow up as needed Start the Keflex twice daily Drink plenty of fluids Start OTC Miconazole cream on the red, itchy areas (externally) twice daily Call with any questions or concerns Happy Early Rudene Anda!

## 2014-08-10 NOTE — Addendum Note (Signed)
Addended by: Peggyann Shoals on: 08/10/2014 04:55 PM   Modules accepted: Orders

## 2014-08-10 NOTE — Progress Notes (Signed)
Pre visit review using our clinic review tool, if applicable. No additional management support is needed unless otherwise documented below in the visit note. 

## 2014-08-10 NOTE — Progress Notes (Signed)
   Subjective:    Patient ID: Cassie Rich, female    DOB: 1976-01-22, 39 y.o.   MRN: [id number]  HPI Dysuria- pt reports she recently switched soaps.  Had 'a lot of itching, burning'.  Discomfort w/ sex- this has mostly resolved.  Increased urinary frequency.  No urgency.  Some hesitancy.  No fevers.  sxs started ~2 weeks ago.  No vaginal d/c.  + suprapubic pressure   Review of Systems For ROS see HPI     Objective:   Physical Exam  Constitutional: She appears well-developed and well-nourished. No distress.  Abdominal: Soft. Bowel sounds are normal. She exhibits no distension. There is tenderness (mild suprapubic tenderness).  Genitourinary: No vaginal discharge (scant vaginal d/c) found.  Erythematous labia majora  Vitals reviewed.         Assessment & Plan:

## 2014-08-10 NOTE — Assessment & Plan Note (Signed)
New.  Pt's sxs and UA consistent w/ infxn.  Start abx.  Send for culture.  Adjust tx prn.

## 2014-08-10 NOTE — Assessment & Plan Note (Signed)
New.  Wet prep collected. Pt w/ evidence of external irritation.  Start OTC miconazole cream topically.  Await wet prep results.  Reviewed supportive care and red flags that should prompt return. Pt expressed understanding and is in agreement w/ plan.

## 2014-08-13 LAB — URINE CULTURE: Colony Count: >=100000

## 2014-08-14 ENCOUNTER — Other Ambulatory Visit: Payer: Self-pay | Admitting: General Practice

## 2014-08-14 LAB — CERVICOVAGINAL ANCILLARY ONLY
Wet Prep (BD Affirm): NEGATIVE
Wet Prep (BD Affirm): NEGATIVE
Wet Prep (BD Affirm): POSITIVE — AB

## 2014-08-14 MED ORDER — METRONIDAZOLE 500 MG PO TABS: 500.0000 mg | ORAL_TABLET | Freq: Two times a day (BID) | ORAL | Status: DC

## 2014-08-17 ENCOUNTER — Other Ambulatory Visit: Payer: 59 | Admitting: Lab

## 2014-08-17 ENCOUNTER — Ambulatory Visit: Payer: 59 | Admitting: Family

## 2014-08-20 ENCOUNTER — Telehealth: Payer: Self-pay | Admitting: Hematology & Oncology

## 2014-08-20 NOTE — Telephone Encounter (Signed)
Left pt message to call and reschedule missed appointment from Friday.

## 2015-01-03 ENCOUNTER — Emergency Department (HOSPITAL_COMMUNITY)
Admission: EM | Admit: 2015-01-03 | Discharge: 2015-01-03 | Disposition: A | Discharge status: Home / Self Care | Payer: 59 | Attending: Emergency Medicine | Admitting: Emergency Medicine

## 2015-01-03 ENCOUNTER — Encounter (HOSPITAL_COMMUNITY): Payer: Self-pay | Admitting: Emergency Medicine

## 2015-01-03 DIAGNOSIS — M5417 Radiculopathy, lumbosacral region: Secondary | ICD-10-CM | POA: Diagnosis not present

## 2015-01-03 DIAGNOSIS — Z79899 Other long term (current) drug therapy: Secondary | ICD-10-CM | POA: Insufficient documentation

## 2015-01-03 DIAGNOSIS — Z86718 Personal history of other venous thrombosis and embolism: Secondary | ICD-10-CM | POA: Insufficient documentation

## 2015-01-03 DIAGNOSIS — M545 Low back pain: Secondary | ICD-10-CM | POA: Diagnosis present

## 2015-01-03 DIAGNOSIS — Z8719 Personal history of other diseases of the digestive system: Secondary | ICD-10-CM | POA: Diagnosis not present

## 2015-01-03 MED ORDER — DIAZEPAM 5 MG PO TABS: 5.0000 mg | ORAL_TABLET | Freq: Three times a day (TID) | ORAL | Status: DC | PRN

## 2015-01-03 MED ORDER — PREDNISONE 50 MG PO TABS: 50.0000 mg | ORAL_TABLET | Freq: Every day | ORAL | Status: DC

## 2015-01-03 MED ORDER — KETOROLAC TROMETHAMINE 60 MG/2ML IM SOLN
60.0000 mg | Freq: Once | INTRAMUSCULAR | Status: AC
  Administered 2015-01-03: 60 mg via INTRAMUSCULAR
  Filled 2015-01-03: qty 2

## 2015-01-03 MED ORDER — HYDROCODONE-ACETAMINOPHEN 5-325 MG PO TABS: 1.0000 | ORAL_TABLET | Freq: Four times a day (QID) | ORAL | Status: DC | PRN

## 2015-01-03 MED ORDER — OXYCODONE-ACETAMINOPHEN 5-325 MG PO TABS
1.0000 | ORAL_TABLET | Freq: Once | ORAL | Status: AC
Start: 2015-01-03 — End: 2015-01-03
  Administered 2015-01-03: 1 via ORAL
  Filled 2015-01-03: qty 1

## 2015-01-03 MED ORDER — DIAZEPAM 5 MG PO TABS
5.0000 mg | ORAL_TABLET | Freq: Once | ORAL | Status: AC
  Administered 2015-01-03: 5 mg via ORAL
  Filled 2015-01-03: qty 1

## 2015-01-03 NOTE — ED Notes (Addendum)
Pt reports feeling a " pulling sensation" in low back yesterday. She walk 3 miles on treadmill at 4:30pm. Aching increased to spasm and sharp pain. Pt. Took a Goodie poweder and a mucle relaxant without relief. Pt currently is unable to sit due to pain in low back, radiating to l/leg. Denies history of back pain

## 2015-01-03 NOTE — Discharge Instructions (Signed)
Start taking prednisone as prescribed daily, take until all gone. Valium for muscle spasms as prescribed. You can take every 6 hours. norco as prescribed as needed for severe pain. Follow-up with primary care doctor if not improving.  Lumbosacral Radiculopathy Lumbosacral radiculopathy is a pinched nerve or nerves in the low back (lumbosacral area). When this happens you may have weakness in your legs and may not be able to stand on your toes. You may have pain going down into your legs. There may be difficulties with walking normally. There are many causes of this problem. Sometimes this may happen from an injury, or simply from arthritis or boney problems. It may also be caused by other illnesses such as diabetes. If there is no improvement after treatment, further studies may be done to find the exact cause. DIAGNOSIS  X-rays may be needed if the problems become long standing. Electromyograms may be done. This study is one in which the working of nerves and muscles is studied. HOME CARE INSTRUCTIONS   Applications of ice packs may be helpful. Ice can be used in a plastic bag with a towel around it to prevent frostbite to skin. This may be used every 2 hours for 20 to 30 minutes, or as needed, while awake, or as directed by your caregiver.  Only take over-the-counter or prescription medicines for pain, discomfort, or fever as directed by your caregiver.  If physical therapy was prescribed, follow your caregiver's directions. SEEK IMMEDIATE MEDICAL CARE IF:   You have pain not controlled with medications.  You seem to be getting worse rather than better.  You develop increasing weakness in your legs.  You develop loss of bowel or bladder control.  You have difficulty with walking or balance, or develop clumsiness in the use of your legs.  You have a fever. MAKE SURE YOU:   Understand these instructions.  Will watch your condition.  Will get help right away if you are not doing well  or get worse. Document Released: 05/18/2005 Document Revised: 08/10/2011 Document Reviewed: 01/06/2008 [facility] Patient Information 2015 [place], [place]. This information is not intended to replace advice given to you by your health care provider. Make sure you discuss any questions you have with your health care provider.

## 2015-01-03 NOTE — ED Provider Notes (Signed)
CSN: [id number]     Arrival date & time 01/03/15  9449 History   First MD Initiated Contact with Patient 01/03/15 1003     Chief Complaint  Patient presents with  . Back Pain    pain in lower back , radiating to r/leg     (Consider location/radiation/quality/duration/timing/severity/associated sxs/prior Treatment) HPI Cassie Rich is a 39 y.o. female with hx of DVT, GERD, presents to ED with complaint of lower back pain radiating into left buttock and around left hip. Pt states pain started last night around bed time. Pt states she walked 3 mi on the treadmill yesterday, but denies any other strenuous physical activity. 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 Pt states pain is sharp, worse with sitting and movements. Pt denies problems with bladder or bowels. Pt denies numbness or weakness in feet. Denies urinary symptoms. No fever, chills. Took aspirin and a muscle relaxant this morning with no relief. States no hx of back problems in the past.   Past Medical History  Diagnosis Date  . DVT (deep venous thrombosis)   . GERD (gastroesophageal reflux disease)   . Hemorrhoids    Past Surgical History  Procedure Laterality Date  . Tubal ligation     History reviewed. No pertinent family history. History  Substance Use Topics  . Smoking status: Never Smoker   . Smokeless tobacco: Not on file  . Alcohol Use: Yes     Comment: 2 glasses of wine per week and socially   OB History    Gravida Para Term Preterm AB TAB SAB Ectopic Multiple Living   2 2             Review of Systems  Constitutional: Negative for fever and chills.  Respiratory: Negative for cough, chest tightness and shortness of breath.   Cardiovascular: Negative for chest pain, palpitations and leg swelling.  Genitourinary: Negative for dysuria, flank pain and pelvic pain.  Musculoskeletal: Positive for back pain and arthralgias. Negative for myalgias, neck pain and neck stiffness.  Skin: Negative for rash.    Neurological: Negative for dizziness, weakness, numbness and headaches.  All other systems reviewed and are negative.     Allergies  Review of patient's allergies indicates no known allergies.  Home Medications   Prior to Admission medications   Medication Sig Start Date End Date Taking? Authorizing Provider  FOLIC ACID PO Take by mouth.    Historical Provider, MD  IRON PO Take by mouth.    Historical Provider, MD  metroNIDAZOLE (FLAGYL) 500 MG tablet Take 1 tablet (500 mg total) by mouth 2 (two) times daily. 08/14/14   Midge Minium, MD   BP 137/70 mmHg  Pulse 92  Temp(Src) 98.2 F (36.8 C) (Oral)  Resp 20  SpO2 97%  LMP 12/27/2014 (Exact Date) Physical Exam  Constitutional: She appears well-developed and well-nourished. No distress.  Eyes: Conjunctivae are normal.  Neck: Neck supple.  Cardiovascular: Normal rate and regular rhythm.   Musculoskeletal:       Arms: Midline and paravertebral lumbar spine back tenderness. Tender to palpation in the left SI joint and left buttock. No pain with bilateral straight leg raise.  Neurological: She is alert.  5/5 and equal lower extremity strength. 2+ and equal patellar reflexes bilaterally. Pt able to dorsiflex bilateral toes and feet with good strength against resistance. Equal sensation bilaterally over thighs and lower legs.   Skin: Skin is warm and dry.  Nursing note and vitals reviewed.  ED Course  Procedures (including critical care time) Labs Review Labs Reviewed - No data to display  Imaging Review No results found.   EKG Interpretation None      MDM   Final diagnoses:  Lumbosacral radiculopathy   Patient with acute onset of lower back pain with no injuries. No history of back issues in the past.Patient has normal neurological exam. There is no urinary incontinence or retention. No problems with bowels.Patient states she did urinate on herself but states that is because she did not want to sit down on a  toilet because of pain. Patient's exam and history is consistent with radicular pain versus muscle spasms or both. She appears to be in a lot of pain, she is tearful, she will has pain with sitting on the stretcher and is standing up in the room.At this time no evidence of cauda equina and I do not think she needs any imaging on emergent basis. I will order her pain medications, Toradol and Percocet ordered. Will give Valium. Discussed treatment plan with pain medications and following up with primary care doctor.  11:10 AM Feels better after medications. Ready to be dc home.   Filed Vitals:   01/03/15 1002  BP: 137/70  Pulse: 92  Temp: 98.2 F (36.8 C)  TempSrc: Oral  Resp: 20  SpO2: 97%     Jeannett Senior, PA-C 01/03/15 1110  Debby Freiberg, MD 01/04/15 1045

## 2015-01-29 ENCOUNTER — Telehealth: Payer: Self-pay | Admitting: Family Medicine

## 2015-01-29 MED ORDER — CLOTRIMAZOLE-BETAMETHASONE 1-0.05 % EX CREA: 1.0000 "application " | TOPICAL_CREAM | Freq: Two times a day (BID) | CUTANEOUS | Status: DC

## 2015-01-29 NOTE — Telephone Encounter (Signed)
Medication filled to pharmacy as requested.   

## 2015-01-29 NOTE — Telephone Encounter (Signed)
Pt requesting refill on the med/cream that goes between toes for athletes foot. Send to [facility] on [address]. Pt states she needs about 1 tube per year.

## 2015-01-29 NOTE — Telephone Encounter (Signed)
Last OV 09/10/14 lotrisone cream was filled 12/18/13 30g with 0 refills, states for 1 time use.    Please advise if ok to fill?

## 2015-01-29 NOTE — Telephone Encounter (Signed)
Ok for refill x 1 

## 2015-03-05 MED ORDER — CLOTRIMAZOLE-BETAMETHASONE 1-0.05 % EX CREA: 1.0000 "application " | TOPICAL_CREAM | Freq: Two times a day (BID) | CUTANEOUS | Status: DC

## 2015-03-05 NOTE — Telephone Encounter (Signed)
Pt asked that I check on last call where she requested this med. She said [facility] never got it. Please resend.

## 2015-03-05 NOTE — Addendum Note (Signed)
Addended by: Davis Gourd on: 03/05/2015 11:37 AM   Modules accepted: Orders

## 2015-03-05 NOTE — Telephone Encounter (Signed)
Medication filled again today. It went through in our system again today.

## 2015-03-29 ENCOUNTER — Encounter: Payer: 59 | Admitting: Family Medicine

## 2016-06-04 ENCOUNTER — Emergency Department (HOSPITAL_COMMUNITY)
Admission: EM | Admit: 2016-06-04 | Discharge: 2016-06-04 | Disposition: A | Discharge status: Home / Self Care | Payer: 59 | Attending: Emergency Medicine | Admitting: Emergency Medicine

## 2016-06-04 ENCOUNTER — Encounter (HOSPITAL_COMMUNITY): Payer: Self-pay | Admitting: Emergency Medicine

## 2016-06-04 ENCOUNTER — Emergency Department (HOSPITAL_COMMUNITY): Payer: 59

## 2016-06-04 DIAGNOSIS — R0602 Shortness of breath: Secondary | ICD-10-CM | POA: Diagnosis not present

## 2016-06-04 DIAGNOSIS — R079 Chest pain, unspecified: Secondary | ICD-10-CM

## 2016-06-04 DIAGNOSIS — Z7982 Long term (current) use of aspirin: Secondary | ICD-10-CM | POA: Diagnosis not present

## 2016-06-04 DIAGNOSIS — R0789 Other chest pain: Secondary | ICD-10-CM | POA: Insufficient documentation

## 2016-06-04 LAB — CBC
HCT: 38.3 % (ref 36.0–46.0)
HEMOGLOBIN: 12.6 g/dL (ref 12.0–15.0)
MCH: 24.7 pg — ABNORMAL LOW (ref 26.0–34.0)
MCHC: 32.9 g/dL (ref 30.0–36.0)
MCV: 75.1 fL — AB (ref 78.0–100.0)
Platelets: 362 10*3/uL (ref 150–400)
RBC: 5.1 MIL/uL (ref 3.87–5.11)
RDW: 14.5 % (ref 11.5–15.5)
WBC: 11.9 10*3/uL — ABNORMAL HIGH (ref 4.0–10.5)

## 2016-06-04 LAB — BASIC METABOLIC PANEL
Anion gap: 10 (ref 5–15)
BUN: 8 mg/dL (ref 6–20)
CALCIUM: 9.4 mg/dL (ref 8.9–10.3)
CHLORIDE: 104 mmol/L (ref 101–111)
CO2: 22 mmol/L (ref 22–32)
CREATININE: 0.73 mg/dL (ref 0.44–1.00)
GFR calc Af Amer: >60 mL/min (ref >60)
GFR calc non Af Amer: >60 mL/min (ref >60)
GLUCOSE: 105 mg/dL — AB (ref 65–99)
Potassium: 4.1 mmol/L (ref 3.5–5.1)
Sodium: 136 mmol/L (ref 135–145)

## 2016-06-04 LAB — I-STAT TROPONIN, ED
Troponin i, poc: 0 ng/mL (ref 0.00–0.08)
Troponin i, poc: 0 ng/mL (ref 0.00–0.08)

## 2016-06-04 LAB — BRAIN NATRIURETIC PEPTIDE: B Natriuretic Peptide: 38.8 pg/mL (ref 0.0–100.0)

## 2016-06-04 LAB — POC URINE PREG, ED: Preg Test, Ur: NEGATIVE

## 2016-06-04 MED ORDER — IOPAMIDOL (ISOVUE-370) INJECTION 76%
100.0000 mL | Freq: Once | INTRAVENOUS | Status: AC | PRN
  Administered 2016-06-04: 75 mL via INTRAVENOUS

## 2016-06-04 MED ORDER — IOPAMIDOL (ISOVUE-370) INJECTION 76%
INTRAVENOUS | Status: AC
  Filled 2016-06-04: qty 100

## 2016-06-04 NOTE — ED Notes (Signed)
ED Provider at bedside. 

## 2016-06-04 NOTE — ED Triage Notes (Signed)
Pt reports intermittent CP and SOB accompanied by L arm tingling for the past week that is worse at night. Also reports having a HA. Pt speaking in full sentence in triage.

## 2016-06-04 NOTE — ED Provider Notes (Signed)
[facility] DEPT Provider Note   CSN: [id number] Arrival date & time: 06/04/16  1332     History   Chief Complaint Chief Complaint  Patient presents with  . Chest Pain    HPI Cassie Rich is a 41 y.o. female with history of DVT 10 years ago, GERD who presents with a two-week history of left-sided chest pain and shortness of breath. Patient describes her chest pain is intermittent and described as a pressure/heaviness. Patient has no shortness of breath that is worse with laying down. It is not worse with exertion. Her chest pain is not pleuritic. Patient has had symptoms radiating to her left sided neck and left arm. She describes the symptoms as a tingling sensation. Patient also reports intermittent tingling in her left leg, but this has been present prior to her chest pain and shortness of breath. Patient had a DVT 10 years ago after breaking her ankle. Patient is not currently on anticoagulants and has not been in 9 years. Patient has taken aspirin for her symptoms. Patient denies any abdominal pain, nausea, vomiting, urinary symptoms. Patient reports family history of CAD prior to 89, but no other risk factors. Patient denies any recent long trips, new leg pain or swelling, exogenous estrogen use, recent surgeries, or known cancer.  HPI  Past Medical History:  Diagnosis Date  . DVT (deep venous thrombosis) ([place])   . GERD (gastroesophageal reflux disease)   . Hemorrhoids     Patient Active Problem List   Diagnosis Date Noted  . Dysuria 08/10/2014  . Vaginitis and vulvovaginitis 08/10/2014  . Elevated WBC count 02/14/2014  . Obesity (BMI 30-39.9) 12/18/2013  . Athlete's foot on left 12/18/2013  . Elevated glucose 12/18/2013  . Screening for malignant neoplasm of the cervix 10/27/2012  . Encounter for routine gynecological examination 10/27/2012  . UTI (lower urinary tract infection) 09/17/2011  . INTERNAL HEMORRHOIDS 07/03/2009  . CARPAL TUNNEL SYNDROME, RIGHT  06/07/2008  . OVERWEIGHT 08/25/2007    Past Surgical History:  Procedure Laterality Date  . TUBAL LIGATION      OB History    Gravida Para Term Preterm AB Living   2 2           SAB TAB Ectopic Multiple Live Births                   Home Medications    Prior to Admission medications   Medication Sig Start Date End Date Taking? Authorizing Provider  aspirin 325 MG EC tablet Take 650 mg by mouth every 6 (six) hours as needed for pain.   Yes Historical Provider, MD  naproxen sodium (ANAPROX) 220 MG tablet Take 440 mg by mouth 2 (two) times daily as needed (pain).   Yes Historical Provider, MD  clotrimazole-betamethasone (LOTRISONE) cream Apply 1 application topically 2 (two) times daily. Patient not taking: Reported on 06/04/2016 03/05/15   Midge Minium, MD  diazepam (VALIUM) 5 MG tablet Take 1 tablet (5 mg total) by mouth every 8 (eight) hours as needed for anxiety. Patient not taking: Reported on 06/04/2016 01/03/15   Jeannett Senior, PA-C  HYDROcodone-acetaminophen (NORCO) 5-325 MG per tablet Take 1 tablet by mouth every 6 (six) hours as needed. Patient not taking: Reported on 06/04/2016 01/03/15   Tatyana Kirichenko, PA-C  metroNIDAZOLE (FLAGYL) 500 MG tablet Take 1 tablet (500 mg total) by mouth 2 (two) times daily. Patient not taking: Reported on 06/04/2016 08/14/14   Midge Minium, MD  predniSONE (DELTASONE) 50  MG tablet Take 1 tablet (50 mg total) by mouth daily. Patient not taking: Reported on 06/04/2016 01/03/15   Jeannett Senior, PA-C    Family History History reviewed. No pertinent family history.  Social History Social History  Substance Use Topics  . Smoking status: Never Smoker  . Smokeless tobacco: Not on file  . Alcohol use Yes     Comment: 2 glasses of wine per week and socially     Allergies   Patient has no known allergies.   Review of Systems Review of Systems  Constitutional: Negative for chills and fever.  HENT: Negative for facial swelling  and sore throat.   Respiratory: Positive for cough (minor last night, not significant, no hemoptysis), chest tightness and shortness of breath.   Cardiovascular: Positive for chest pain. Negative for leg swelling.  Gastrointestinal: Negative for abdominal pain, nausea and vomiting.  Genitourinary: Negative for dysuria.  Musculoskeletal: Negative for back pain.  Skin: Negative for rash and wound.  Neurological: Negative for headaches.  Psychiatric/Behavioral: The patient is not nervous/anxious.      Physical Exam Updated Vital Signs BP 120/77   Pulse 69   Temp 97.8 F (36.6 C) (Oral)   Resp 16   LMP 05/23/2016 (Exact Date)   SpO2 100%   Physical Exam  Constitutional: She appears well-developed and well-nourished. No distress.  HENT:  Head: Normocephalic and atraumatic.  Mouth/Throat: Oropharynx is clear and moist. No oropharyngeal exudate.  Eyes: Conjunctivae are normal. Pupils are equal, round, and reactive to light. Right eye exhibits no discharge. Left eye exhibits no discharge. No scleral icterus.  Neck: Normal range of motion. Neck supple. No thyromegaly present.  Cardiovascular: Normal rate, regular rhythm, normal heart sounds and intact distal pulses.  Exam reveals no gallop and no friction rub.   No murmur heard. Pulmonary/Chest: Effort normal and breath sounds normal. No stridor. No respiratory distress. She has no wheezes. She has no rales. She exhibits tenderness.    Abdominal: Soft. Bowel sounds are normal. She exhibits no distension. There is no tenderness. There is no rebound and no guarding.  Musculoskeletal: She exhibits no edema.  No tenderness to palpation to left arm, normal sensation, equal bilateral grip strength  Lymphadenopathy:    She has no cervical adenopathy.  Neurological: She is alert. Coordination normal.  Skin: Skin is warm and dry. No rash noted. She is not diaphoretic. No pallor.  Psychiatric: She has a normal mood and affect.  Nursing note  and vitals reviewed.    ED Treatments / Results  Labs (all labs ordered are listed, but only abnormal results are displayed) Labs Reviewed  BASIC METABOLIC PANEL - Abnormal; Notable for the following:       Result Value   Glucose, Bld 105 (*)    All other components within normal limits  CBC - Abnormal; Notable for the following:    WBC 11.9 (*)    MCV 75.1 (*)    MCH 24.7 (*)    All other components within normal limits  BRAIN NATRIURETIC PEPTIDE  I-STAT TROPOININ, ED  POC URINE PREG, ED  I-STAT TROPOININ, ED    EKG  EKG Interpretation  Date/Time:  Thursday June 04 2016 14:02:48 EST Ventricular Rate:  73 PR Interval:    QRS Duration: 92 QT Interval:  397 QTC Calculation: 438 R Axis:   12 Text Interpretation:  Sinus rhythm Low voltage, precordial leads No significant change since last tracing Confirmed by Winfred Leeds  MD, SAM [phone]) on 06/04/2016 5:47:07  PM       Radiology Dg Chest 2 View  Result Date: 06/04/2016 CLINICAL DATA:  Chest pain, history of blood clot in leg 10 years ago. Nonsmoker. Feels on well. No EXAM: CHEST  2 VIEW COMPARISON:  CXR 06/20/2012, 01/08/2014 CT FINDINGS: The heart size top-normal in appearance and mediastinal contours are within normal limits. Both lungs are clear. The visualized skeletal structures are unremarkable. IMPRESSION: No active cardiopulmonary disease. Electronically Signed   By: Ashley Royalty M.D.   On: 06/04/2016 14:20   Ct Angio Chest Pe W And/or Wo Contrast  Result Date: 06/04/2016 CLINICAL DATA:  Chest pain and shortness of breath accompanied by a left arm tingling. EXAM: CT ANGIOGRAPHY CHEST WITH CONTRAST TECHNIQUE: Multidetector CT imaging of the chest was performed using the standard protocol during bolus administration of intravenous contrast. Multiplanar CT image reconstructions and MIPs were obtained to evaluate the vascular anatomy. CONTRAST:  75 cc Isovue 370 intravenously. COMPARISON:  Chest radiograph 06/04/2016  FINDINGS: Cardiovascular: Satisfactory opacification of the pulmonary arteries to the segmental level. No evidence of pulmonary embolism. Normal heart size. No pericardial effusion. Mediastinum/Nodes: No enlarged mediastinal, hilar, or axillary lymph nodes. Thyroid gland, trachea, and esophagus demonstrate no significant findings. Lungs/Pleura: No pleural effusion or pneumothorax. No evidence of focal airspace consolidation. Mild hypoventilatory changes dependently. There is a suggestion of mosaic attenuation of the lung parenchyma, usually associated with small airway disease. Upper Abdomen: No acute abnormality. Musculoskeletal: No chest wall abnormality. No acute or significant osseous findings. Review of the MIP images confirms the above findings. IMPRESSION: No evidence of pulmonary embolus or other vascular findings. Mosaic attenuation of the lung parenchyma usually associated with small airway disease and/or air trapping. Electronically Signed   By: Fidela Salisbury M.D.   On: 06/04/2016 17:17    Procedures Procedures (including critical care time)  Medications Ordered in ED Medications  iopamidol (ISOVUE-370) 76 % injection (not administered)  iopamidol (ISOVUE-370) 76 % injection 100 mL (75 mLs Intravenous Contrast Given 06/04/16 1656)     Initial Impression / Assessment and Plan / ED Course  I have reviewed the triage vital signs and the nursing notes.  Pertinent labs & imaging results that were available during my care of the patient were reviewed by me and considered in my medical decision making (see chart for details).  Clinical Course     HEART score 1. CBC shows WBC 11.9. BMP unremarkable. Delta troponin negative. BNP 38.8. Urine pregnancy negative. CT angiogram chest shows no PE or other vascular findings; attenuation of the lung parenchyma usually associated with small airway disease and/or air trapping. EKG shows NSR with no significant change since last tracing. Discussed  use of OTC pain medications as needed as prescribed OTC. Patient to follow up with PCP for further evaluation and treatment of symptoms. Return precautions discussed. Patient understands and agrees with plan. Patient also evaluated by Dr. Winfred Leeds who guided the patient's management and agrees with plan.  Final Clinical Impressions(s) / ED Diagnoses   Final diagnoses:  Nonspecific chest pain  Shortness of breath    New Prescriptions New Prescriptions   No medications on file     Caryl Ada 06/04/16 Lysle Morales, MD 06/05/16 [phone]

## 2016-06-04 NOTE — Discharge Instructions (Signed)
We have ruled out a current heart attack, blood clot in your lungs, pneumonia, and other emergent causes of your chest pain and shortness of breath today.  Treatment: You can over-the-counter pain medication such as Tylenol, ibuprofen, or aspirin.  Follow-up: Please follow-up with your primary care provider as soon as possible for further evaluation and treatment of your symptoms. Please return to emergency department if you develop any new or worsening symptoms.

## 2016-06-04 NOTE — ED Provider Notes (Signed)
Complains of anterior chest pain onset 4 days ago, constant, worse with lying supine improved with sitting up. Improved with deep inspiration. Pain is nonexertional. Presently asymptomatic. On exam no distress lungs clear auscultation heart regular rate and rhythm no murmurs rubs abdomen obese, nontender extremities without edema   Orlie Dakin, MD 06/04/16 1815

## 2016-09-30 ENCOUNTER — Encounter: Payer: 59 | Admitting: Family Medicine

## 2017-01-07 ENCOUNTER — Telehealth: Payer: Self-pay | Admitting: Family Medicine

## 2017-01-07 MED ORDER — FLUCONAZOLE 150 MG PO TABS: 150.0000 mg | ORAL_TABLET | Freq: Once | ORAL | 0 refills | Status: AC

## 2017-01-07 NOTE — Telephone Encounter (Signed)
Patient states that she has had a itching and irritation in her vagina since last week and that she tried an OTC cream, but she states that she hasn't had any relief. She states that she has had this before and that she knows what it is. Patient was advised that per Dr. Virgil Benedict recommendation that she wait a couple of days since  the beginning of a menstrual cycle can cause irritation due to hormone changes and because she is having heavy bleeding. Patient stated that she cannot make it two more days and was wondering if Dr. Birdie Riddle would just call her something in to the pharmacy like before. Patient uses the [facility] on [address]. Please advise.

## 2017-01-07 NOTE — Telephone Encounter (Signed)
Pt made aware and expressed an understanding. Med filled to pharmacy as requested.

## 2017-01-07 NOTE — Telephone Encounter (Signed)
Please advise 

## 2017-01-07 NOTE — Telephone Encounter (Signed)
Ok for Diflucan 150mg  x1 dose.  Will need appt if no improvement

## 2017-01-20 ENCOUNTER — Encounter: Payer: Self-pay | Admitting: Family Medicine

## 2017-01-20 ENCOUNTER — Other Ambulatory Visit (HOSPITAL_COMMUNITY)
Admission: RE | Admit: 2017-01-20 | Discharge: 2017-01-20 | Disposition: A | Discharge status: Home / Self Care | Payer: 59 | Source: Ambulatory Visit | Attending: Family Medicine | Admitting: Family Medicine

## 2017-01-20 ENCOUNTER — Ambulatory Visit (INDEPENDENT_AMBULATORY_CARE_PROVIDER_SITE_OTHER): Payer: 59 | Admitting: Family Medicine

## 2017-01-20 VITALS — BP 123/83 | HR 85 | Temp 98.1°F | Resp 16 | Ht 67.0 in | Wt 235.0 lb

## 2017-01-20 DIAGNOSIS — Z124 Encounter for screening for malignant neoplasm of cervix: Secondary | ICD-10-CM | POA: Insufficient documentation

## 2017-01-20 DIAGNOSIS — Z1231 Encounter for screening mammogram for malignant neoplasm of breast: Secondary | ICD-10-CM | POA: Diagnosis not present

## 2017-01-20 DIAGNOSIS — E669 Obesity, unspecified: Secondary | ICD-10-CM | POA: Diagnosis not present

## 2017-01-20 DIAGNOSIS — Z01419 Encounter for gynecological examination (general) (routine) without abnormal findings: Secondary | ICD-10-CM

## 2017-01-20 NOTE — Progress Notes (Signed)
Pre visit review using our clinic review tool, if applicable. No additional management support is needed unless otherwise documented below in the visit note. 

## 2017-01-20 NOTE — Progress Notes (Signed)
   Subjective:    Patient ID: Cassie Rich, female    DOB: [birth year], 41 y.o.   MRN: [id number]  HPI CPE- no concerns today.  Due for pap and mammo.   Review of Systems Patient reports no vision/ hearing changes, adenopathy,fever, weight change,  persistant/recurrent hoarseness , swallowing issues, chest pain, palpitations, edema, persistant/recurrent cough, hemoptysis, dyspnea (rest/exertional/paroxysmal nocturnal), gastrointestinal bleeding (melena, rectal bleeding), abdominal pain, significant heartburn, bowel changes, GU symptoms (dysuria, hematuria, incontinence), Gyn symptoms (abnormal  bleeding, pain),  syncope, focal weakness, memory loss, numbness & tingling, skin/hair/nail changes, abnormal bruising or bleeding, anxiety, or depression.     Objective:   Physical Exam  General Appearance:    Alert, cooperative, no distress, appears stated age, obese  Head:    Normocephalic, without obvious abnormality, atraumatic  Eyes:    PERRL, conjunctiva/corneas clear, EOM's intact, fundi    benign, both eyes  Ears:    Normal TM's and external ear canals, both ears  Nose:   Nares normal, septum midline, mucosa normal, no drainage    or sinus tenderness  Throat:   Lips, mucosa, and tongue normal; teeth and gums normal  Neck:   Supple, symmetrical, trachea midline, no adenopathy;    Thyroid: no enlargement/tenderness/nodules  Back:     Symmetric, no curvature, ROM normal, no CVA tenderness  Lungs:     Clear to auscultation bilaterally, respirations unlabored  Chest Wall:    No tenderness or deformity   Heart:    Regular rate and rhythm, S1 and S2 normal, no murmur, rub   or gallop  Breast Exam:    No tenderness, masses, or nipple abnormality  Abdomen:     Soft, non-tender, bowel sounds active all four quadrants,    no masses, no organomegaly  Genitalia:    External genitalia normal, cervix normal in appearance, no CMT, uterus in normal size and position, adnexa w/out mass or tenderness,  mucosa pink and moist, no lesions or discharge present  Rectal:    Normal external appearance  Extremities:   Extremities normal, atraumatic, no cyanosis or edema  Pulses:   2+ and symmetric all extremities  Skin:   Skin color, texture, turgor normal, no rashes or lesions  Lymph nodes:   Cervical, supraclavicular, and axillary nodes normal  Neurologic:   CNII-XII intact, normal strength, sensation and reflexes    throughout          Assessment & Plan:

## 2017-01-20 NOTE — Assessment & Plan Note (Signed)
Pt's PE WNL w/ exception of obesity.  Will review labs once available.  UTD on Tdap.  Pap done.  mammo ordered.  Anticipatory guidance provided.

## 2017-01-20 NOTE — Assessment & Plan Note (Signed)
Ongoing issue for pt.  Stressed the need for healthy diet and regular exercise.  Will review her biometric labs done at work this AM.  Will follow.

## 2017-01-20 NOTE — Assessment & Plan Note (Signed)
Pap collected. 

## 2017-01-20 NOTE — Patient Instructions (Signed)
Follow up in 1 year or as needed We'll notify you of your lab results and make any changes if needed Continue to work on healthy diet and regular exercise- you can do it! We'll call you with your mammogram appt Call with any questions or concerns Happy Labor Day!!!

## 2017-01-21 LAB — CYTOLOGY - PAP
ADEQUACY: ABSENT
DIAGNOSIS: NEGATIVE
HPV (WINDOPATH): DETECTED

## 2017-01-22 ENCOUNTER — Other Ambulatory Visit: Payer: Self-pay | Admitting: General Practice

## 2017-01-22 DIAGNOSIS — B977 Papillomavirus as the cause of diseases classified elsewhere: Secondary | ICD-10-CM

## 2017-01-22 MED ORDER — METRONIDAZOLE 500 MG PO TABS: 500.0000 mg | ORAL_TABLET | Freq: Two times a day (BID) | ORAL | 0 refills | Status: DC

## 2017-01-29 ENCOUNTER — Ambulatory Visit
Admission: RE | Admit: 2017-01-29 | Discharge: 2017-01-29 | Disposition: A | Discharge status: Home / Self Care | Payer: 59 | Source: Ambulatory Visit | Attending: Family Medicine | Admitting: Family Medicine

## 2017-01-29 DIAGNOSIS — Z1231 Encounter for screening mammogram for malignant neoplasm of breast: Secondary | ICD-10-CM

## 2017-02-02 ENCOUNTER — Other Ambulatory Visit: Payer: Self-pay | Admitting: Family Medicine

## 2017-02-02 DIAGNOSIS — R928 Other abnormal and inconclusive findings on diagnostic imaging of breast: Secondary | ICD-10-CM

## 2017-02-03 ENCOUNTER — Telehealth: Payer: Self-pay

## 2017-02-03 NOTE — Telephone Encounter (Signed)
Spoke with patient regarding GYN referral. Advised patient per Dr. Birdie Riddle, to follow up with GYN for HPV diagnosis. Patient verbalized understanding, will call GYN for appointment, denies questions at this time.

## 2017-02-08 ENCOUNTER — Ambulatory Visit
Admission: RE | Admit: 2017-02-08 | Discharge: 2017-02-08 | Disposition: A | Discharge status: Home / Self Care | Payer: 59 | Source: Ambulatory Visit | Attending: Family Medicine | Admitting: Family Medicine

## 2017-02-08 ENCOUNTER — Other Ambulatory Visit: Payer: Self-pay | Admitting: Family Medicine

## 2017-02-08 DIAGNOSIS — R928 Other abnormal and inconclusive findings on diagnostic imaging of breast: Secondary | ICD-10-CM

## 2017-02-08 DIAGNOSIS — R921 Mammographic calcification found on diagnostic imaging of breast: Secondary | ICD-10-CM

## 2017-04-07 ENCOUNTER — Ambulatory Visit (HOSPITAL_COMMUNITY)
Admission: EM | Admit: 2017-04-07 | Discharge: 2017-04-07 | Disposition: A | Discharge status: Home / Self Care | Payer: 59 | Attending: Family Medicine | Admitting: Family Medicine

## 2017-04-07 ENCOUNTER — Encounter (HOSPITAL_COMMUNITY): Payer: Self-pay | Admitting: Emergency Medicine

## 2017-04-07 DIAGNOSIS — Z3202 Encounter for pregnancy test, result negative: Secondary | ICD-10-CM

## 2017-04-07 DIAGNOSIS — R300 Dysuria: Secondary | ICD-10-CM

## 2017-04-07 DIAGNOSIS — R350 Frequency of micturition: Secondary | ICD-10-CM | POA: Diagnosis present

## 2017-04-07 DIAGNOSIS — N3001 Acute cystitis with hematuria: Secondary | ICD-10-CM

## 2017-04-07 LAB — POCT URINALYSIS DIP (DEVICE)
Bilirubin Urine: NEGATIVE
GLUCOSE, UA: NEGATIVE mg/dL
KETONES UR: NEGATIVE mg/dL
Nitrite: NEGATIVE
PROTEIN: 30 mg/dL — AB
Specific Gravity, Urine: 1.01 (ref 1.005–1.030)
Urobilinogen, UA: 0.2 mg/dL (ref 0.0–1.0)
pH: 7 (ref 5.0–8.0)

## 2017-04-07 LAB — POCT PREGNANCY, URINE: PREG TEST UR: NEGATIVE

## 2017-04-07 MED ORDER — CEPHALEXIN 500 MG PO CAPS: 500.0000 mg | ORAL_CAPSULE | Freq: Two times a day (BID) | ORAL | 0 refills | Status: DC

## 2017-04-07 NOTE — ED Triage Notes (Signed)
Pt. Stated, I've had stomach pain for 2 days. Denies any other symptoms

## 2017-04-10 LAB — URINE CULTURE: Culture: >=100000 — AB

## 2017-04-14 NOTE — ED Provider Notes (Signed)
  [place]    ASSESSMENT & PLAN:  1. Acute cystitis with hematuria     Meds ordered this encounter  Medications  . cephALEXin (KEFLEX) 500 MG capsule    Sig: Take 1 capsule (500 mg total) 2 (two) times daily by mouth.    Dispense:  10 capsule    Refill:  0   Urine culture sent. Will notify patient when results available. Will follow up with her PCP or here if not showing improvement over the next 48 hours, sooner if needed.  Outlined signs and symptoms indicating need for more acute intervention. Patient verbalized understanding. After Visit Summary given.  SUBJECTIVE:  Cassie Rich is a 41 y.o. female who complains of urinary frequency, urgency and dysuria for the past 2 days. No flank pain, fever, chills, abnormal vaginal discharge or bleeding. Hematuria: not present.  Normal PO intake. No flank or abdominal pain. No self treatment.  LMP: Patient's last menstrual period was 03/24/2017.  ROS: As in HPI.  OBJECTIVE:  Vitals:   04/07/17 1739 04/07/17 1740  BP: (!) 148/79   Pulse: 72   Resp: 17   Temp: 97.9 F (36.6 C)   TempSrc: Oral   SpO2: 100%   Weight:  230 lb (104.3 kg)  Height:  5\' 6"  (1.676 m)    Appears well, in no apparent distress. Abdomen is soft without tenderness, guarding, mass, rebound or organomegaly. No CVA tenderness or inguinal adenopathy noted.  Labs Reviewed  URINE CULTURE - Abnormal; Notable for the following components:      Result Value   Culture >=100,000 COLONIES/mL PROTEUS MIRABILIS (*)    Organism ID, Bacteria PROTEUS MIRABILIS (*)    All other components within normal limits  POCT URINALYSIS DIP (DEVICE) - Abnormal; Notable for the following components:   Hgb urine dipstick LARGE (*)    Protein, ur 30 (*)    Leukocytes, UA LARGE (*)    All other components within normal limits  POCT PREGNANCY, URINE    No Known Allergies  Past Medical History:  Diagnosis Date  . DVT (deep venous thrombosis) ([place])   .  GERD (gastroesophageal reflux disease)   . Hemorrhoids    Social History   Socioeconomic History  . Marital status: Married    Spouse name: Not on file  . Number of children: Not on file  . Years of education: Not on file  . Highest education level: Not on file  Social Needs  . Financial resource strain: Not on file  . Food insecurity - worry: Not on file  . Food insecurity - inability: Not on file  . Transportation needs - medical: Not on file  . Transportation needs - non-medical: Not on file  Occupational History  . Not on file  Tobacco Use  . Smoking status: Never Smoker  . Smokeless tobacco: Never Used  Substance and Sexual Activity  . Alcohol use: No    Frequency: Never    Comment: 2 glasses of wine per week and socially  . Drug use: No  . Sexual activity: Yes    Birth control/protection: Surgical  Other Topics Concern  . Not on file  Social History Narrative  . Not on file   Family History  Problem Relation Age of Onset  . Breast cancer Neg Hx        Vanessa Kick, MD 04/14/17 [phone]

## 2017-08-09 ENCOUNTER — Ambulatory Visit
Admission: RE | Admit: 2017-08-09 | Discharge: 2017-08-09 | Disposition: A | Discharge status: Home / Self Care | Payer: 59 | Source: Ambulatory Visit | Attending: Family Medicine | Admitting: Family Medicine

## 2017-08-09 DIAGNOSIS — R921 Mammographic calcification found on diagnostic imaging of breast: Secondary | ICD-10-CM

## 2017-11-10 ENCOUNTER — Emergency Department (HOSPITAL_COMMUNITY): Payer: 59

## 2017-11-10 ENCOUNTER — Emergency Department (HOSPITAL_COMMUNITY)
Admission: EM | Admit: 2017-11-10 | Discharge: 2017-11-10 | Disposition: A | Discharge status: Home / Self Care | Payer: 59 | Attending: Emergency Medicine | Admitting: Emergency Medicine

## 2017-11-10 ENCOUNTER — Other Ambulatory Visit: Payer: Self-pay

## 2017-11-10 ENCOUNTER — Encounter (HOSPITAL_COMMUNITY): Payer: Self-pay | Admitting: Emergency Medicine

## 2017-11-10 DIAGNOSIS — R0602 Shortness of breath: Secondary | ICD-10-CM | POA: Insufficient documentation

## 2017-11-10 DIAGNOSIS — R079 Chest pain, unspecified: Secondary | ICD-10-CM | POA: Diagnosis present

## 2017-11-10 LAB — I-STAT BETA HCG BLOOD, ED (MC, WL, AP ONLY): I-stat hCG, quantitative: <5 m[IU]/mL (ref <5)

## 2017-11-10 LAB — BASIC METABOLIC PANEL
ANION GAP: 8 (ref 5–15)
BUN: 11 mg/dL (ref 6–20)
CO2: 23 mmol/L (ref 22–32)
Calcium: 9 mg/dL (ref 8.9–10.3)
Chloride: 108 mmol/L (ref 101–111)
Creatinine, Ser: 0.81 mg/dL (ref 0.44–1.00)
GFR calc non Af Amer: >60 mL/min (ref >60)
GLUCOSE: 164 mg/dL — AB (ref 65–99)
POTASSIUM: 4.4 mmol/L (ref 3.5–5.1)
Sodium: 139 mmol/L (ref 135–145)

## 2017-11-10 LAB — CBC
HEMATOCRIT: 39.6 % (ref 36.0–46.0)
HEMOGLOBIN: 12.9 g/dL (ref 12.0–15.0)
MCH: 25.4 pg — ABNORMAL LOW (ref 26.0–34.0)
MCHC: 32.6 g/dL (ref 30.0–36.0)
MCV: 78 fL (ref 78.0–100.0)
Platelets: 375 10*3/uL (ref 150–400)
RBC: 5.08 MIL/uL (ref 3.87–5.11)
RDW: 15.4 % (ref 11.5–15.5)
WBC: 13.5 10*3/uL — ABNORMAL HIGH (ref 4.0–10.5)

## 2017-11-10 LAB — I-STAT TROPONIN, ED
Troponin i, poc: 0 ng/mL (ref 0.00–0.08)
Troponin i, poc: 0 ng/mL (ref 0.00–0.08)

## 2017-11-10 LAB — BRAIN NATRIURETIC PEPTIDE: B NATRIURETIC PEPTIDE 5: 23.1 pg/mL (ref 0.0–100.0)

## 2017-11-10 LAB — D-DIMER, QUANTITATIVE (NOT AT ARMC)

## 2017-11-10 MED ORDER — MELOXICAM 7.5 MG PO TABS: 7.5000 mg | ORAL_TABLET | Freq: Every day | ORAL | 0 refills | Status: DC | PRN

## 2017-11-10 MED ORDER — KETOROLAC TROMETHAMINE 60 MG/2ML IM SOLN
60.0000 mg | Freq: Once | INTRAMUSCULAR | Status: AC
  Administered 2017-11-10: 60 mg via INTRAMUSCULAR
  Filled 2017-11-10: qty 2

## 2017-11-10 NOTE — ED Notes (Signed)
discharge instructions reviewed with pt. Pt verbalized understanding. Pt to follow up with PCP. Pt ambulatory to waiting room.

## 2017-11-10 NOTE — ED Triage Notes (Signed)
Pt c/o constant left sided chest pains all week. Today pains got worse with SOB. Denies n/v.

## 2017-11-10 NOTE — ED Provider Notes (Signed)
Emergency Department Provider Note   I have reviewed the triage vital signs and the nursing notes.   HISTORY  Chief Complaint Chest Pain   HPI Cassie Rich is a 42 y.o. female history of DVT and no other medical problems who presents to the emergency department today secondary to chest pain.  Patient states that it is been going on for about a week with some intermittent chest pressure but today she started as some shortness of breath as well.  She states that the shortness of breath seems to be worse when she lies flat better when elevated.  No lower extremity swelling at the new.  She states when she had her DVT is because she had a cast on and she has not had any symptoms of a DVT with this episode.  No fever.  No productive cough and very little cough at all.  No history of anxiety.  No nausea, vomiting or diaphoresis. No other associated or modifying symptoms.    Past Medical History:  Diagnosis Date  . DVT (deep venous thrombosis) ([place])   . GERD (gastroesophageal reflux disease)   . Hemorrhoids     Patient Active Problem List   Diagnosis Date Noted  . Dysuria 08/10/2014  . Vaginitis and vulvovaginitis 08/10/2014  . Elevated WBC count 02/14/2014  . Obesity (BMI 30-39.9) 12/18/2013  . Athlete's foot on left 12/18/2013  . Elevated glucose 12/18/2013  . Screening for malignant neoplasm of cervix 10/27/2012  . Encounter for routine gynecological examination 10/27/2012  . UTI (lower urinary tract infection) 09/17/2011  . INTERNAL HEMORRHOIDS 07/03/2009  . CARPAL TUNNEL SYNDROME, RIGHT 06/07/2008  . OVERWEIGHT 08/25/2007    Past Surgical History:  Procedure Laterality Date  . TUBAL LIGATION      Current Outpatient Rx  . Order #: [id number] Class: Historical Med  . Order #: [id number] Class: Normal    Allergies Patient has no known allergies.  Family History  Problem Relation Age of Onset  . Breast cancer Neg Hx     Social History Social History    Tobacco Use  . Smoking status: Never Smoker  . Smokeless tobacco: Never Used  Substance Use Topics  . Alcohol use: Yes    Frequency: Never    Comment: 2 glasses of wine per weekend  . Drug use: No    Review of Systems  All other systems negative except as documented in the HPI. All pertinent positives and negatives as reviewed in the HPI. ____________________________________________   PHYSICAL EXAM:  VITAL SIGNS: ED Triage Vitals  Enc Vitals Group     BP 11/10/17 1805 (!) 144/94     Pulse Rate 11/10/17 1805 86     Resp 11/10/17 1805 20     Temp 11/10/17 1805 98.1 F (36.7 C)     Temp Source 11/10/17 1805 Oral     SpO2 11/10/17 1805 100 %     Weight 11/10/17 1803 235 lb (106.6 kg)     Height 11/10/17 1803 5\' 6"  (1.676 m)    Constitutional: Alert and oriented. Well appearing and in no acute distress. Eyes: Conjunctivae are normal. PERRL. EOMI. Head: Atraumatic. Nose: No congestion/rhinnorhea. Mouth/Throat: Mucous membranes are moist.  Oropharynx non-erythematous. Neck: No stridor.  No meningeal signs.   Cardiovascular: Normal rate, regular rhythm. Good peripheral circulation. Grossly normal heart sounds.   Respiratory: Normal respiratory effort.  No retractions. Lungs CTAB. Gastrointestinal: Soft and nontender. No distention.  Musculoskeletal: No lower extremity tenderness nor edema. No gross deformities of  extremities. Neurologic:  Normal speech and language. No gross focal neurologic deficits are appreciated.  Skin:  Skin is warm, dry and intact. No rash noted.  ____________________________________________   LABS (all labs ordered are listed, but only abnormal results are displayed)  Labs Reviewed  BASIC METABOLIC PANEL - Abnormal; Notable for the following components:      Result Value   Glucose, Bld 164 (*)    All other components within normal limits  CBC - Abnormal; Notable for the following components:   WBC 13.5 (*)    MCH 25.4 (*)    All other  components within normal limits  D-DIMER, QUANTITATIVE (NOT AT [facility])  BRAIN NATRIURETIC PEPTIDE  TROPONIN I  I-STAT TROPONIN, ED  I-STAT BETA HCG BLOOD, ED ([facility], [facility], [facility] ONLY)  I-STAT TROPONIN, ED   ____________________________________________  EKG   EKG Interpretation  Date/Time:  Wednesday November 10 2017 18:04:57 EDT Ventricular Rate:  88 PR Interval:    QRS Duration: 93 QT Interval:  375 QTC Calculation: 454 R Axis:   16 Text Interpretation:  Sinus rhythm Low voltage, precordial leads Borderline T abnormalities, anterior leads No significant change since last tracing Confirmed by Merrily Pew [phone]) on 11/10/2017 9:05:17 PM       ____________________________________________  RADIOLOGY  Dg Chest 2 View  Result Date: 11/10/2017 CLINICAL DATA:  Left-sided chest pain. EXAM: CHEST - 2 VIEW COMPARISON:  June 04, 2016 FINDINGS: The heart size and mediastinal contours are within normal limits. Both lungs are clear. The visualized skeletal structures are unremarkable. IMPRESSION: No active cardiopulmonary disease. Electronically Signed   By: Dorise Bullion III M.D   On: 11/10/2017 18:32    ____________________________________________   PROCEDURES  Procedure(s) performed:   Procedures   ____________________________________________   INITIAL IMPRESSION / ASSESSMENT AND PLAN / ED COURSE  Secondary to history we will check a d-dimer, with her orthopnea consider CHF however she does have any crackles, lower extremity edema, JVD, S3 or pulmonary edema on her chest x-ray making that unlikely.  We will get a repeat troponin and repeat EKG.  Toradol for discomfort.  If all normal will be stable for discharge home.  Clinical Course as of Nov 11 2310  Wed Nov 10, 2017  2310 Unlikely PE  D-Dimer, Quant: <0.27 [JM]  2310 Doubt CHF  B Natriuretic Peptide: 23.1 [JM]  2310 Delta troponins unchanged. Doubt ACS.  Troponin i, poc: 0.00 [JM]  2310 No evidence of pneumonia   [JM]    2311 Significan tly improved symptoms with toradol. MSK?   [JM]    Clinical Course User Index [JM] Tayquan Gassman, Corene Cornea, MD    Pertinent labs & imaging results that were available during my care of the patient were reviewed by me and considered in my medical decision making (see chart for details).  ____________________________________________  FINAL CLINICAL IMPRESSION(S) / ED DIAGNOSES  Final diagnoses:  None     MEDICATIONS GIVEN DURING THIS VISIT:  Medications  ketorolac (TORADOL) injection 60 mg (60 mg Intramuscular Given 11/10/17 2146)     NEW OUTPATIENT MEDICATIONS STARTED DURING THIS VISIT:  New Prescriptions   No medications on file    Note:  This note was prepared with assistance of Dragon voice recognition software. Occasional wrong-word or sound-a-like substitutions may have occurred due to the inherent limitations of voice recognition software.   Merrily Pew, MD 11/10/17 2312

## 2018-01-03 ENCOUNTER — Other Ambulatory Visit: Payer: Self-pay

## 2018-01-03 ENCOUNTER — Ambulatory Visit (INDEPENDENT_AMBULATORY_CARE_PROVIDER_SITE_OTHER): Payer: 59 | Admitting: Family Medicine

## 2018-01-03 ENCOUNTER — Encounter: Payer: Self-pay | Admitting: Family Medicine

## 2018-01-03 DIAGNOSIS — E119 Type 2 diabetes mellitus without complications: Secondary | ICD-10-CM | POA: Insufficient documentation

## 2018-01-03 HISTORY — DX: Type 2 diabetes mellitus without complications: E11.9

## 2018-01-03 NOTE — Assessment & Plan Note (Signed)
New dx for pt.  Recent fasting sugar of 142 and A1C of 7.2 confirm dx.  Reviewed dx w/ pt, possible complications, need to limit carbohydrates (and what those consist of).  Stressed need for regular exercise.  Discussed need for eye exam.  Foot exam done today.  Microalbumin collected.  Check labs.  Will follow closely.  Total time spent w/ pt, 30+ minutes w/ >50% spent on counseling.

## 2018-01-03 NOTE — Patient Instructions (Signed)
Follow up in 3-4 months to recheck sugars We'll notify you of your lab results and make any changes if needed We'll call you with your referral for Diabetes Education Start limiting your carbohydrate intake- rice, pasta, bread, potatoes, etc 1/2 the plate is veggies, 1/4 is protein (meat), 1/4 starch Goal is for 30 minutes of exercise 3-4 days a week.  Start small- walking is great! Schedule an eye exam and have them send me the report Call with any questions or concerns Hang in there!  We'll make sure this is under control!!!

## 2018-01-03 NOTE — Progress Notes (Signed)
   Subjective:    Patient ID: Cassie Rich, female    DOB: 1975-12-02, 42 y.o.   MRN: [id number]  HPI Elevated glucose- pt had labs done for work and fasting sugar was 142.  A1C was 7.2.  + family hx of DM (mom).  Was told to see her physician.  Pt doesn't eat a lot of sweets- but does like breads and pastas.   Review of Systems For ROS see HPI     Objective:   Physical Exam  Constitutional: She is oriented to person, place, and time. She appears well-developed and well-nourished. No distress.  obese  HENT:  Head: Normocephalic and atraumatic.  Eyes: Pupils are equal, round, and reactive to light. Conjunctivae and EOM are normal.  Neck: Normal range of motion. Neck supple. No thyromegaly present.  Cardiovascular: Normal rate, regular rhythm, normal heart sounds and intact distal pulses.  No murmur heard. Pulmonary/Chest: Effort normal and breath sounds normal. No respiratory distress.  Abdominal: Soft. She exhibits no distension. There is no tenderness.  Musculoskeletal: She exhibits no edema.  Lymphadenopathy:    She has no cervical adenopathy.  Neurological: She is alert and oriented to person, place, and time.  Skin: Skin is warm and dry.  Psychiatric: She has a normal mood and affect. Her behavior is normal.  Vitals reviewed.         Assessment & Plan:

## 2018-01-04 ENCOUNTER — Other Ambulatory Visit: Payer: 59

## 2018-01-04 DIAGNOSIS — E119 Type 2 diabetes mellitus without complications: Secondary | ICD-10-CM

## 2018-01-04 LAB — BASIC METABOLIC PANEL
BUN: 11 mg/dL (ref 6–23)
CO2: 27 mEq/L (ref 19–32)
Calcium: 10 mg/dL (ref 8.4–10.5)
Chloride: 101 mEq/L (ref 96–112)
Creatinine, Ser: 0.89 mg/dL (ref 0.40–1.20)
GFR: 89.31 mL/min (ref >60.00)
Glucose, Bld: 209 mg/dL — ABNORMAL HIGH (ref 70–99)
Potassium: 4.1 mEq/L (ref 3.5–5.1)
Sodium: 136 mEq/L (ref 135–145)

## 2018-01-04 LAB — LIPID PANEL
CHOL/HDL RATIO: 4
Cholesterol: 153 mg/dL (ref 0–200)
HDL: 36.7 mg/dL — AB (ref >39.00)
LDL CALC: 87 mg/dL (ref 0–99)
NONHDL: 116.69
Triglycerides: 147 mg/dL (ref 0.0–149.0)
VLDL: 29.4 mg/dL (ref 0.0–40.0)

## 2018-01-04 LAB — HEPATIC FUNCTION PANEL
ALK PHOS: 69 U/L (ref 39–117)
ALT: 15 U/L (ref 0–35)
AST: 21 U/L (ref 0–37)
Albumin: 4.6 g/dL (ref 3.5–5.2)
BILIRUBIN TOTAL: 0.9 mg/dL (ref 0.2–1.2)
Bilirubin, Direct: 0.2 mg/dL (ref 0.0–0.3)
Total Protein: 7.9 g/dL (ref 6.0–8.3)

## 2018-01-04 LAB — MICROALBUMIN / CREATININE URINE RATIO
Creatinine,U: 119.2 mg/dL
MICROALB UR: 4.7 mg/dL — AB (ref 0.0–1.9)
Microalb Creat Ratio: 3.9 mg/g (ref 0.0–30.0)

## 2018-01-04 LAB — TSH: TSH: 1.68 u[IU]/mL (ref 0.35–4.50)

## 2018-01-05 LAB — HEMOGLOBIN A1C
EAG (MMOL/L): 8.9 (calc)
HEMOGLOBIN A1C: 7.2 %{Hb} — AB (ref <5.7)
MEAN PLASMA GLUCOSE: 160 (calc)

## 2018-01-20 ENCOUNTER — Ambulatory Visit: Payer: 59

## 2018-02-03 ENCOUNTER — Ambulatory Visit: Payer: 59

## 2018-02-16 ENCOUNTER — Telehealth: Payer: Self-pay | Admitting: Family Medicine

## 2018-02-16 MED ORDER — CLOTRIMAZOLE-BETAMETHASONE 1-0.05 % EX CREA: 1.0000 "application " | TOPICAL_CREAM | Freq: Two times a day (BID) | CUTANEOUS | 1 refills | Status: DC

## 2018-02-16 NOTE — Telephone Encounter (Signed)
Ok for pt to continue this medication?

## 2018-02-16 NOTE — Telephone Encounter (Signed)
What kind of discomfort in her toes?  This is not a pain medication- this is an antifungal/steroid combination

## 2018-02-16 NOTE — Telephone Encounter (Signed)
Medication filled to pharmacy as requested.   

## 2018-02-16 NOTE — Telephone Encounter (Signed)
Copied from [employer] [id number]. Topic: General - Other >> Feb 16, 2018 11:35 AM Janace Aris A wrote: Medication: clotrimazole-betamethasone (LOTRISONE) cream   Has the patient contacted their pharmacy? No, Patient says this medication was discontinued, but needs a refill sent to the pharmacy because she is experiencing discomfort in her toes   Preferred Pharmacy (with phone number or street name): [facility] ([address]), [facility] [place]  [phone] (Phone) [phone] (Fax)

## 2018-02-16 NOTE — Telephone Encounter (Signed)
Ok to refill Lotrisone- apply twice daily prn, #45 grams, 1 refill

## 2018-02-16 NOTE — Telephone Encounter (Signed)
Spoke with pt. She is having itching and some peeling between her toes. Says it happens this time of year.

## 2018-03-01 ENCOUNTER — Other Ambulatory Visit: Payer: Self-pay | Admitting: Family Medicine

## 2018-03-01 DIAGNOSIS — R921 Mammographic calcification found on diagnostic imaging of breast: Secondary | ICD-10-CM

## 2018-03-29 ENCOUNTER — Other Ambulatory Visit: Payer: Self-pay | Admitting: Family Medicine

## 2018-03-29 DIAGNOSIS — R921 Mammographic calcification found on diagnostic imaging of breast: Secondary | ICD-10-CM

## 2018-04-05 ENCOUNTER — Ambulatory Visit
Admission: RE | Admit: 2018-04-05 | Discharge: 2018-04-05 | Disposition: A | Discharge status: Home / Self Care | Payer: 59 | Source: Ambulatory Visit | Attending: Family Medicine | Admitting: Family Medicine

## 2018-04-05 DIAGNOSIS — R921 Mammographic calcification found on diagnostic imaging of breast: Secondary | ICD-10-CM

## 2018-09-01 ENCOUNTER — Encounter: Payer: Self-pay | Admitting: Family Medicine

## 2018-09-01 ENCOUNTER — Ambulatory Visit (INDEPENDENT_AMBULATORY_CARE_PROVIDER_SITE_OTHER): Payer: 59 | Admitting: Family Medicine

## 2018-09-01 ENCOUNTER — Other Ambulatory Visit: Payer: Self-pay

## 2018-09-01 DIAGNOSIS — E119 Type 2 diabetes mellitus without complications: Secondary | ICD-10-CM | POA: Diagnosis not present

## 2018-09-01 DIAGNOSIS — E669 Obesity, unspecified: Secondary | ICD-10-CM

## 2018-09-01 NOTE — Progress Notes (Signed)
Virtual Visit via Telephone Note  I connected with Cassie Rich on 09/01/18 at 10:30 AM EDT by telephone and verified that I am speaking with the correct person using two identifiers.   I discussed the limitations, risks, security and privacy concerns of performing an evaluation and management service by telephone and the availability of in person appointments. I also discussed with the patient that there may be a patient responsible charge related to this service. The patient expressed understanding and agreed to proceed.  Pt is at home, I am in office  History of Present Illness: DM- pt was dx'd in August w/ A1C of 7.2  She is attempting to control w/ diet and exercise.  Pt is not getting regular exercise but wants to incorporate this starting this weekend.  Admits to some stress eating.  Denies CP, SOB, HAs, visual changes, abd pain, N/V, edema.  No numbness/tingling of hands/feet.  UTD on microalbumin, foot exam.  Due for eye exam.  Obesity- last BMI 38.   Observations/Objective: Pt is able to speak clearly, coherently without shortness of breath or increased work of breathing.  Thought process is linear.  Mood is appropriate.   Assessment and Plan: DM- ongoing issue for pt.  Attempting to control w/ diet and exercise but admits to being lax on both.  Due for eye exam- plans to schedule once she is able.  UTD on foot exam and microalbumin.  Check labs.  Adjust tx prn   Obesity- ongoing issue.  Pt was not able to weigh today but plans to bring her updated weight to her lab visit.  Stressed need for healthy diet and regular exercise.  Check labs to risk stratify.  Follow Up Instructions:    I discussed the assessment and treatment plan with the patient. The patient was provided an opportunity to ask questions and all were answered. The patient agreed with the plan and demonstrated an understanding of the instructions.   The patient was advised to call back or seek an in-person  evaluation if the symptoms worsen or if the condition fails to improve as anticipated.  I provided 11 minutes of non-face-to-face time during this encounter.   [name], MD

## 2018-09-01 NOTE — Progress Notes (Signed)
I have discussed the procedure for the virtual visit with the patient who has given consent to proceed with assessment and treatment.   BETHANY DILLARD, CMA     

## 2018-09-05 ENCOUNTER — Other Ambulatory Visit: Payer: 59

## 2018-09-08 ENCOUNTER — Other Ambulatory Visit: Payer: 59

## 2019-01-02 ENCOUNTER — Ambulatory Visit: Payer: 59 | Admitting: Family Medicine

## 2019-01-06 ENCOUNTER — Ambulatory Visit (INDEPENDENT_AMBULATORY_CARE_PROVIDER_SITE_OTHER): Payer: 59 | Admitting: Physician Assistant

## 2019-01-06 ENCOUNTER — Encounter: Payer: Self-pay | Admitting: Physician Assistant

## 2019-01-06 ENCOUNTER — Other Ambulatory Visit: Payer: Self-pay

## 2019-01-06 VITALS — Ht 65.5 in | Wt 225.0 lb

## 2019-01-06 DIAGNOSIS — N760 Acute vaginitis: Secondary | ICD-10-CM

## 2019-01-06 DIAGNOSIS — B9689 Other specified bacterial agents as the cause of diseases classified elsewhere: Secondary | ICD-10-CM | POA: Diagnosis not present

## 2019-01-06 MED ORDER — METRONIDAZOLE 0.75 % EX GEL: CUTANEOUS | 0 refills | Status: DC

## 2019-01-06 NOTE — Progress Notes (Signed)
   Virtual Visit via Video   I connected with patient on 01/06/19 at  4:15 PM EDT by a video enabled telemedicine application and verified that I am speaking with the correct person using two identifiers.  Location patient: Home Location provider: [facility], Office Persons participating in the virtual visit: Patient, Provider, [place] (Patina Moore)  I discussed the limitations of evaluation and management by telemedicine and the availability of in person appointments. The patient expressed understanding and agreed to proceed.  Subjective:   HPI:   Patient presents via Doxy[employer]Me today c/o vaginal odor and discomfort starting Monday after finishing her menstrual cycle. Denies any noted discharge. Denies vaginal lesion. Denies urinary symptoms or concern for STI. Denies fever, chills, malaise or fatigue. Has history of BV and notes this feels the same as the onset of prior episodes.   ROS:   See pertinent positives and negatives per HPI.  Patient Active Problem List   Diagnosis Date Noted  . Diabetes mellitus without complication ([place]) 37/08/8887  . Vaginitis and vulvovaginitis 08/10/2014  . Obesity (BMI 30-39.9) 12/18/2013  . Athlete's foot on left 12/18/2013  . Screening for malignant neoplasm of cervix 10/27/2012  . Encounter for routine gynecological examination 10/27/2012  . INTERNAL HEMORRHOIDS 07/03/2009  . CARPAL TUNNEL SYNDROME, RIGHT 06/07/2008  . OVERWEIGHT 08/25/2007    Social History   Tobacco Use  . Smoking status: Never Smoker  . Smokeless tobacco: Never Used  Substance Use Topics  . Alcohol use: Yes    Frequency: Never    Comment: 2 glasses of wine per weekend    Current Outpatient Medications:  .  ibuprofen (ADVIL,MOTRIN) 200 MG tablet, Take 200 mg by mouth as needed., Disp: , Rfl:  .  Melatonin 5 MG TABS, Take 1 tablet by mouth at bedtime as needed (sleep)., Disp: , Rfl:   No Known Allergies  Objective:   Ht 5' 5.5" (1.664 m)   Wt 225 lb  (102.1 kg)   LMP 12/26/2018   BMI 36.87 kg/m   Patient is well-developed, well-nourished in no acute distress.  Resting comfortably at home.  Head is normocephalic, atraumatic.  No labored breathing.  Speech is clear and coherent with logical contest.  Patient is alert and oriented at baseline.   Assessment and Plan:     1. Bacterial vaginitis + history of classic symptoms for her. Low risk STI. Will empirically treat for BV with metrogel. Supportive measures and hygiene measures reviewed. If not resolving with treatment she knows she will have to come in office for exam and testing.  Leeanne Rio, PA-C 01/06/2019

## 2019-01-06 NOTE — Progress Notes (Signed)
I have discussed the procedure for the virtual visit with the patient who has given consent to proceed with assessment and treatment.   Oberon Hehir, CMA     

## 2019-03-21 ENCOUNTER — Other Ambulatory Visit: Payer: Self-pay | Admitting: Family Medicine

## 2019-03-21 DIAGNOSIS — R921 Mammographic calcification found on diagnostic imaging of breast: Secondary | ICD-10-CM

## 2019-03-29 ENCOUNTER — Encounter: Payer: Self-pay | Admitting: Family Medicine

## 2019-03-29 ENCOUNTER — Ambulatory Visit (INDEPENDENT_AMBULATORY_CARE_PROVIDER_SITE_OTHER): Payer: 59 | Admitting: Family Medicine

## 2019-03-29 ENCOUNTER — Other Ambulatory Visit: Payer: Self-pay

## 2019-03-29 VITALS — BP 125/82 | HR 83 | Temp 97.9°F | Resp 16 | Ht 66.0 in | Wt 240.1 lb

## 2019-03-29 DIAGNOSIS — F419 Anxiety disorder, unspecified: Secondary | ICD-10-CM

## 2019-03-29 DIAGNOSIS — E119 Type 2 diabetes mellitus without complications: Secondary | ICD-10-CM

## 2019-03-29 DIAGNOSIS — M62838 Other muscle spasm: Secondary | ICD-10-CM | POA: Diagnosis not present

## 2019-03-29 DIAGNOSIS — G44209 Tension-type headache, unspecified, not intractable: Secondary | ICD-10-CM

## 2019-03-29 DIAGNOSIS — E669 Obesity, unspecified: Secondary | ICD-10-CM | POA: Diagnosis not present

## 2019-03-29 DIAGNOSIS — L02422 Furuncle of left axilla: Secondary | ICD-10-CM

## 2019-03-29 MED ORDER — DOXYCYCLINE HYCLATE 100 MG PO TABS: 100.0000 mg | ORAL_TABLET | Freq: Two times a day (BID) | ORAL | 0 refills | Status: DC

## 2019-03-29 MED ORDER — METHOCARBAMOL 500 MG PO TABS: 500.0000 mg | ORAL_TABLET | Freq: Three times a day (TID) | ORAL | 0 refills | Status: DC | PRN

## 2019-03-29 NOTE — Patient Instructions (Signed)
Follow up in 2-3 weeks to recheck stress levels We'll notify you of your lab results and make any changes if needed START the Doxycycline twice daily- take w/ food Warm compresses under the arm The tingling and headaches are due to muscle spasm.  Ibuprofen as needed for pain and use the methocarbamol for muscle spasm.  Heating pad will also help Try and change your positions throughout the day to avoid stiffness Increase your water intake Call with any questions or concerns Hang in there!

## 2019-03-29 NOTE — Progress Notes (Signed)
   Subjective:    Patient ID: Cassie Rich, female    DOB: May 31, 1976, 43 y.o.   MRN: [id number]  HPI Obesity- pt has gained 15 lbs since last visit  DM- ongoing issue.  Had been diet controlled but pt has gained 15 lbs.  Overdue for A1C.  Due for microalbumin, eye exam, foot exam.  No CP, SOB, visual changes, abd pain, N/V.  Denies numbness of feet.  + LE edema.  Pt reports limited water intake.  'stress'- pt started immediately crying when asked what brought her in today.  Has oral infxn that requires root canal upcoming.  Having tingling in L shoulder and HAs 'only on 1 side of my head'. Pt reports sxs started ~1 month ago.  Indicates she wanted to come be evaluated but is 'scared of bad news'.  Denies weakness.  Tingling is intermittent.  Swelling under L arm.  Area is TTP.  Pt reports muscle stiffness/tension in neck.  + fatigue.  Is working from home, does a lot of sitting.   Review of Systems For ROS see HPI     Objective:   Physical Exam Vitals signs reviewed.  Constitutional:      General: She is not in acute distress.    Appearance: She is well-developed. She is obese.  HENT:     Head: Normocephalic and atraumatic.  Eyes:     Conjunctiva/sclera: Conjunctivae normal.     Pupils: Pupils are equal, round, and reactive to light.  Neck:     Musculoskeletal: Normal range of motion and neck supple.     Thyroid: No thyromegaly.     Comments: Bilateral trap spasm, L>R Cardiovascular:     Rate and Rhythm: Normal rate and regular rhythm.     Heart sounds: Normal heart sounds. No murmur.  Pulmonary:     Effort: Pulmonary effort is normal. No respiratory distress.     Breath sounds: Normal breath sounds.  Abdominal:     General: There is no distension.     Palpations: Abdomen is soft.     Tenderness: There is no abdominal tenderness.  Lymphadenopathy:     Cervical: No cervical adenopathy.  Skin:    General: Skin is warm and dry.     Comments: Small boil in L axilla, TTP   Neurological:     General: No focal deficit present.     Mental Status: She is alert and oriented to person, place, and time.     Cranial Nerves: No cranial nerve deficit.     Motor: No weakness.     Coordination: Coordination normal.     Gait: Gait normal.     Deep Tendon Reflexes: Reflexes normal.  Psychiatric:     Comments: Tearful, anxious, overwhelmed           Assessment & Plan:  Trap spasm- bilaterally but L>R.  Suspect this is causing the tension HA she complains of and the tingling in her arm due to nerve irritation from muscle spasm.  Start Robaxin, ibuprofen, heat, and monitor for improvement.  Boil- new.  Start Doxy.

## 2019-03-30 ENCOUNTER — Other Ambulatory Visit: Payer: 59

## 2019-03-30 DIAGNOSIS — R7309 Other abnormal glucose: Secondary | ICD-10-CM

## 2019-03-30 LAB — LIPID PANEL
Cholesterol: 184 mg/dL (ref 0–200)
HDL: 41.7 mg/dL (ref >39.00)
LDL Cholesterol: 116 mg/dL — ABNORMAL HIGH (ref 0–99)
NonHDL: 142.28
Total CHOL/HDL Ratio: 4
Triglycerides: 132 mg/dL (ref 0.0–149.0)
VLDL: 26.4 mg/dL (ref 0.0–40.0)

## 2019-03-30 LAB — CBC WITH DIFFERENTIAL/PLATELET
Basophils Absolute: 0.1 10*3/uL (ref 0.0–0.1)
Basophils Relative: 0.7 % (ref 0.0–3.0)
Eosinophils Absolute: 0 10*3/uL (ref 0.0–0.7)
Eosinophils Relative: 0.2 % (ref 0.0–5.0)
HCT: 38.9 % (ref 36.0–46.0)
Hemoglobin: 12.6 g/dL (ref 12.0–15.0)
Lymphocytes Relative: 26.5 % (ref 12.0–46.0)
Lymphs Abs: 3.1 10*3/uL (ref 0.7–4.0)
MCHC: 32.5 g/dL (ref 30.0–36.0)
MCV: 77.6 fl — ABNORMAL LOW (ref 78.0–100.0)
Monocytes Absolute: 0.5 10*3/uL (ref 0.1–1.0)
Monocytes Relative: 4.7 % (ref 3.0–12.0)
Neutro Abs: 7.8 10*3/uL — ABNORMAL HIGH (ref 1.4–7.7)
Neutrophils Relative %: 67.9 % (ref 43.0–77.0)
Platelets: 401 10*3/uL — ABNORMAL HIGH (ref 150.0–400.0)
RBC: 5.01 Mil/uL (ref 3.87–5.11)
RDW: 15.3 % (ref 11.5–15.5)
WBC: 11.5 10*3/uL — ABNORMAL HIGH (ref 4.0–10.5)

## 2019-03-30 LAB — BASIC METABOLIC PANEL
BUN: 9 mg/dL (ref 6–23)
CO2: 27 mEq/L (ref 19–32)
Calcium: 9.7 mg/dL (ref 8.4–10.5)
Chloride: 101 mEq/L (ref 96–112)
Creatinine, Ser: 0.73 mg/dL (ref 0.40–1.20)
GFR: 105 mL/min (ref >60.00)
Glucose, Bld: 114 mg/dL — ABNORMAL HIGH (ref 70–99)
Potassium: 4.4 mEq/L (ref 3.5–5.1)
Sodium: 136 mEq/L (ref 135–145)

## 2019-03-30 LAB — HEPATIC FUNCTION PANEL
ALT: 18 U/L (ref 0–35)
AST: 22 U/L (ref 0–37)
Albumin: 4.6 g/dL (ref 3.5–5.2)
Alkaline Phosphatase: 87 U/L (ref 39–117)
Bilirubin, Direct: 0.1 mg/dL (ref 0.0–0.3)
Total Bilirubin: 0.6 mg/dL (ref 0.2–1.2)
Total Protein: 7.8 g/dL (ref 6.0–8.3)

## 2019-03-30 LAB — MICROALBUMIN / CREATININE URINE RATIO
Creatinine,U: 77.2 mg/dL
Microalb Creat Ratio: 1.6 mg/g (ref 0.0–30.0)
Microalb, Ur: 1.2 mg/dL (ref 0.0–1.9)

## 2019-03-30 NOTE — Assessment & Plan Note (Signed)
Ongoing problem.  Previously diet controlled but pt has gained 15 lbs.  Overdue for microalbumin (ordered), eye exam (pt to schedule), foot exam.  Check labs and start meds if needed

## 2019-03-30 NOTE — Assessment & Plan Note (Signed)
Pt has gained 15 lbs.  Discussed need for healthy diet and regular exercise as this will help w/ weight loss and stress management.  Will follow.

## 2019-03-30 NOTE — Assessment & Plan Note (Signed)
New.  Pt is highly anxious today and tearful in office.  Is very worried about potential health issues- to the point she avoids discussing them or coming in.  She is having a lot of work stress (working from home), [place].  Pt is not interested in starting medication at this time but is going to think about it.  Will follow closely.

## 2019-03-31 ENCOUNTER — Other Ambulatory Visit: Payer: Self-pay

## 2019-03-31 DIAGNOSIS — E7800 Pure hypercholesterolemia, unspecified: Secondary | ICD-10-CM

## 2019-03-31 LAB — HEMOGLOBIN A1C
Hgb A1c MFr Bld: 7.3 % of total Hgb — ABNORMAL HIGH (ref <5.7)
Mean Plasma Glucose: 163 (calc)
eAG (mmol/L): 9 (calc)

## 2019-03-31 LAB — TSH: TSH: 2.55 u[IU]/mL (ref 0.35–4.50)

## 2019-03-31 MED ORDER — ATORVASTATIN CALCIUM 10 MG PO TABS: 10.0000 mg | ORAL_TABLET | Freq: Every day | ORAL | 3 refills | Status: DC

## 2019-04-10 ENCOUNTER — Ambulatory Visit
Admission: RE | Admit: 2019-04-10 | Discharge: 2019-04-10 | Disposition: A | Discharge status: Home / Self Care | Payer: 59 | Source: Ambulatory Visit | Attending: Family Medicine | Admitting: Family Medicine

## 2019-04-10 ENCOUNTER — Other Ambulatory Visit: Payer: Self-pay

## 2019-04-10 DIAGNOSIS — R921 Mammographic calcification found on diagnostic imaging of breast: Secondary | ICD-10-CM

## 2019-05-15 ENCOUNTER — Ambulatory Visit: Payer: 59

## 2019-06-13 ENCOUNTER — Ambulatory Visit (INDEPENDENT_AMBULATORY_CARE_PROVIDER_SITE_OTHER): Payer: 59 | Admitting: Emergency Medicine

## 2019-06-13 ENCOUNTER — Other Ambulatory Visit: Payer: Self-pay

## 2019-06-13 DIAGNOSIS — E7800 Pure hypercholesterolemia, unspecified: Secondary | ICD-10-CM | POA: Diagnosis not present

## 2019-06-14 LAB — HEPATIC FUNCTION PANEL
ALT: 15 U/L (ref 0–35)
AST: 19 U/L (ref 0–37)
Albumin: 4.5 g/dL (ref 3.5–5.2)
Alkaline Phosphatase: 86 U/L (ref 39–117)
Bilirubin, Direct: 0.1 mg/dL (ref 0.0–0.3)
Total Bilirubin: 0.5 mg/dL (ref 0.2–1.2)
Total Protein: 7.8 g/dL (ref 6.0–8.3)

## 2019-07-17 ENCOUNTER — Encounter: Payer: Self-pay | Admitting: Neurology

## 2019-07-17 ENCOUNTER — Other Ambulatory Visit: Payer: Self-pay | Admitting: Family Medicine

## 2019-07-17 ENCOUNTER — Ambulatory Visit (INDEPENDENT_AMBULATORY_CARE_PROVIDER_SITE_OTHER): Payer: 59 | Admitting: Family Medicine

## 2019-07-17 ENCOUNTER — Other Ambulatory Visit: Payer: 59

## 2019-07-17 ENCOUNTER — Other Ambulatory Visit: Payer: Self-pay

## 2019-07-17 ENCOUNTER — Encounter: Payer: Self-pay | Admitting: Family Medicine

## 2019-07-17 VITALS — BP 126/81 | HR 79 | Temp 99.0°F | Resp 16 | Ht 66.0 in | Wt 234.5 lb

## 2019-07-17 DIAGNOSIS — R6881 Early satiety: Secondary | ICD-10-CM | POA: Diagnosis not present

## 2019-07-17 DIAGNOSIS — F419 Anxiety disorder, unspecified: Secondary | ICD-10-CM

## 2019-07-17 DIAGNOSIS — E119 Type 2 diabetes mellitus without complications: Secondary | ICD-10-CM | POA: Diagnosis not present

## 2019-07-17 DIAGNOSIS — R0601 Orthopnea: Secondary | ICD-10-CM

## 2019-07-17 DIAGNOSIS — M79622 Pain in left upper arm: Secondary | ICD-10-CM

## 2019-07-17 DIAGNOSIS — R200 Anesthesia of skin: Secondary | ICD-10-CM

## 2019-07-17 DIAGNOSIS — H9201 Otalgia, right ear: Secondary | ICD-10-CM | POA: Diagnosis not present

## 2019-07-17 DIAGNOSIS — R7309 Other abnormal glucose: Secondary | ICD-10-CM

## 2019-07-17 LAB — HEPATIC FUNCTION PANEL
ALT: 20 U/L (ref 0–35)
AST: 23 U/L (ref 0–37)
Albumin: 4.5 g/dL (ref 3.5–5.2)
Alkaline Phosphatase: 93 U/L (ref 39–117)
Bilirubin, Direct: 0.1 mg/dL (ref 0.0–0.3)
Total Bilirubin: 0.5 mg/dL (ref 0.2–1.2)
Total Protein: 7.8 g/dL (ref 6.0–8.3)

## 2019-07-17 LAB — LIPID PANEL
Cholesterol: 109 mg/dL (ref 0–200)
HDL: 33.6 mg/dL — ABNORMAL LOW (ref >39.00)
LDL Cholesterol: 61 mg/dL (ref 0–99)
NonHDL: 75.73
Total CHOL/HDL Ratio: 3
Triglycerides: 72 mg/dL (ref 0.0–149.0)
VLDL: 14.4 mg/dL (ref 0.0–40.0)

## 2019-07-17 LAB — CBC WITH DIFFERENTIAL/PLATELET
Basophils Absolute: 0 10*3/uL (ref 0.0–0.1)
Basophils Relative: 0.4 % (ref 0.0–3.0)
Eosinophils Absolute: 0 10*3/uL (ref 0.0–0.7)
Eosinophils Relative: 0.2 % (ref 0.0–5.0)
HCT: 39.2 % (ref 36.0–46.0)
Hemoglobin: 12.6 g/dL (ref 12.0–15.0)
Lymphocytes Relative: 29.7 % (ref 12.0–46.0)
Lymphs Abs: 3.1 10*3/uL (ref 0.7–4.0)
MCHC: 32.1 g/dL (ref 30.0–36.0)
MCV: 77.5 fl — ABNORMAL LOW (ref 78.0–100.0)
Monocytes Absolute: 0.5 10*3/uL (ref 0.1–1.0)
Monocytes Relative: 5 % (ref 3.0–12.0)
Neutro Abs: 6.8 10*3/uL (ref 1.4–7.7)
Neutrophils Relative %: 64.7 % (ref 43.0–77.0)
Platelets: 399 10*3/uL (ref 150.0–400.0)
RBC: 5.06 Mil/uL (ref 3.87–5.11)
RDW: 15.3 % (ref 11.5–15.5)
WBC: 10.4 10*3/uL (ref 4.0–10.5)

## 2019-07-17 LAB — B12 AND FOLATE PANEL
Folate: 9.9 ng/mL (ref >5.9)
Vitamin B-12: 209 pg/mL — ABNORMAL LOW (ref 211–911)

## 2019-07-17 LAB — TSH: TSH: 1.79 u[IU]/mL (ref 0.35–4.50)

## 2019-07-17 LAB — BASIC METABOLIC PANEL
BUN: 7 mg/dL (ref 6–23)
CO2: 24 mEq/L (ref 19–32)
Calcium: 9.7 mg/dL (ref 8.4–10.5)
Chloride: 103 mEq/L (ref 96–112)
Creatinine, Ser: 0.76 mg/dL (ref 0.40–1.20)
GFR: 100.1 mL/min (ref >60.00)
Glucose, Bld: 161 mg/dL — ABNORMAL HIGH (ref 70–99)
Potassium: 4.7 mEq/L (ref 3.5–5.1)
Sodium: 134 mEq/L — ABNORMAL LOW (ref 135–145)

## 2019-07-17 LAB — VITAMIN D 25 HYDROXY (VIT D DEFICIENCY, FRACTURES): VITD: 10.02 ng/mL — ABNORMAL LOW (ref 30.00–100.00)

## 2019-07-17 MED ORDER — OMEPRAZOLE 40 MG PO CPDR: 40.0000 mg | DELAYED_RELEASE_CAPSULE | Freq: Every day | ORAL | 3 refills | Status: DC

## 2019-07-17 NOTE — Assessment & Plan Note (Signed)
Ongoing issue for pt.  She is down 5 lbs since last visit.  Unclear whether her SOB when lying on L side is due to body habitus, sleep apnea, CHF, or other.  Will continue to follow.

## 2019-07-17 NOTE — Progress Notes (Signed)
Subjective:    Patient ID: Cassie Rich, female    DOB: 08-14-1975, 44 y.o.   MRN: [id number]  HPI DM- has been diet controlled but is on the verge of starting medication w/ last A1C 7.3  Obesity- pt is down 5 lbs since last visit.  BMI 37.85. Unable to lie on L side due to SOB.  + fatigue.  L side numbness/tingling- sxs started 'over a month ago'.  sxs are intermittent.  sxs occur 'in the evening' and 'overnight'.  Will have associated dull HA at times located behind L eye.  abd 'fullness'- pt reports she will feel full more quickly.  Occurs when stomach is empty.  Denies belching or reflux.  No increased gas.  No N/V.    R ear pain- intermittent.  No fever, no drainage.  Anxiety about health- pt is tearful today, 'I just know something is wrong'.  Has been very worried since DVT after broken ankle.  L axillary pain- no obvious skin lesion.  Pt feels area is swollen.  + TTP.  Mammo November 2020 WNL.  Review of Systems For ROS see HPI   This visit occurred during the SARS-CoV-2 public health emergency.  Safety protocols were in place, including screening questions prior to the visit, additional usage of staff PPE, and extensive cleaning of exam room while observing appropriate contact time as indicated for disinfecting solutions.       Objective:   Physical Exam Vitals reviewed.  Constitutional:      General: She is not in acute distress.    Appearance: She is well-developed. She is obese.  HENT:     Head: Normocephalic and atraumatic.     Left Ear: Tympanic membrane normal.     Ears:     Comments: R TM mildly retracted Eyes:     Conjunctiva/sclera: Conjunctivae normal.     Pupils: Pupils are equal, round, and reactive to light.  Neck:     Thyroid: No thyromegaly.  Cardiovascular:     Rate and Rhythm: Normal rate and regular rhythm.     Heart sounds: Normal heart sounds. No murmur.  Pulmonary:     Effort: Pulmonary effort is normal. No respiratory distress.   Breath sounds: Normal breath sounds.  Chest:     Chest wall: Tenderness present. No mass, deformity or swelling.    Abdominal:     General: There is no distension.     Palpations: Abdomen is soft. There is no mass.     Tenderness: There is no abdominal tenderness. There is no guarding or rebound.  Musculoskeletal:     Cervical back: Normal range of motion and neck supple.  Lymphadenopathy:     Cervical: No cervical adenopathy.  Skin:    General: Skin is warm and dry.  Neurological:     General: No focal deficit present.     Mental Status: She is alert and oriented to person, place, and time.     Cranial Nerves: No cranial nerve deficit.     Motor: No weakness.     Coordination: Coordination normal.     Gait: Gait normal.     Deep Tendon Reflexes: Reflexes normal.  Psychiatric:        Behavior: Behavior normal.     Comments: Teaful, anxious           Assessment & Plan:  Early satiety- new.  Suspect there may be a component of GERD contributing to her full feeling but will check CA125 to  ensure no ovarian issues.  Start PPI and monitor for improvement.  L sided numbness- occurring primarily in the evenings.  This is suspicious for a positional or possible anxiety cause.  Asymptomatic here in office today.  Neuro exam WNL.  Will refer to neuro for complete evaluation to r/o underlying condition.  Will also check labs to r/o vit deficiency or electrolyte disturbance.  L axillary pain- new.  No obvious LAD or swelling on exam but pt is TTP.  Had recent normal mammo but will get diagnostic mammo to possibly include axilla.  Pt expressed understanding and is in agreement w/ plan.   R ear pain- new.  PE consistent w/ eustachian tube dysfxn.  Start daily OTC antihistamine.  Orthopnea- new.  Unclear if this is related to body habitus, sleep apnea, CHF, or other.  Will start w/ ECHO and then move to sleep study if needed.  Pt expressed understanding and is in agreement w/ plan.

## 2019-07-17 NOTE — Assessment & Plan Note (Signed)
Ongoing problem for pt.  She is down 5 lbs since last visit but unclear if this is due to lifestyle changes or out of control A1C.  Will check labs to determine if medication is needed.  Foot exam done today.  Encouraged her to schedule eye exam.

## 2019-07-17 NOTE — Assessment & Plan Note (Signed)
Ongoing issue for pt.  Every since she broke her ankle and had subsequent DVT she is very concerned about her health.  Discussed the possibility of starting anxiety medication but pt is not interested at this time.  Will continue to follow.

## 2019-07-17 NOTE — Patient Instructions (Signed)
Follow up in 3-4 months to recheck diabetes We'll notify you of your lab results and make any changes if needed We'll call you with your Neuro referral for the numbness/tingling We'll call you with your ECHO (ultrasound of heart) appt to assess your shortness of breath when lying down START the Omeprazole once daily to improve your stomach fullness START OTC Claritin or Zyrtec daily to improve your ear pain and sinus headache We'll call you with your diagnostic mammogram appt Continue to work on healthy diet and regular exercise Schedule an eye exam at your convenience Call with any questions or concerns Stay Safe!  Stay Healthy!

## 2019-07-18 ENCOUNTER — Other Ambulatory Visit: Payer: Self-pay | Admitting: General Practice

## 2019-07-18 ENCOUNTER — Ambulatory Visit
Admission: RE | Admit: 2019-07-18 | Discharge: 2019-07-18 | Disposition: A | Discharge status: Home / Self Care | Payer: 59 | Source: Ambulatory Visit | Attending: Family Medicine | Admitting: Family Medicine

## 2019-07-18 DIAGNOSIS — M79622 Pain in left upper arm: Secondary | ICD-10-CM

## 2019-07-18 DIAGNOSIS — E119 Type 2 diabetes mellitus without complications: Secondary | ICD-10-CM

## 2019-07-18 LAB — HEMOGLOBIN A1C
Hgb A1c MFr Bld: 7.9 % of total Hgb — ABNORMAL HIGH (ref <5.7)
Mean Plasma Glucose: 180 (calc)
eAG (mmol/L): 10 (calc)

## 2019-07-18 MED ORDER — VITAMIN D (ERGOCALCIFEROL) 1.25 MG (50000 UNIT) PO CAPS: 50000.0000 [IU] | ORAL_CAPSULE | ORAL | 0 refills | Status: DC

## 2019-07-18 MED ORDER — METFORMIN HCL 500 MG PO TABS: 500.0000 mg | ORAL_TABLET | Freq: Two times a day (BID) | ORAL | 1 refills | Status: DC

## 2019-07-19 LAB — CA 125: CA 125: 6 U/mL (ref <35)

## 2019-07-26 ENCOUNTER — Ambulatory Visit (HOSPITAL_COMMUNITY): Payer: 59 | Attending: Cardiology

## 2019-07-26 ENCOUNTER — Other Ambulatory Visit: Payer: Self-pay

## 2019-07-26 DIAGNOSIS — R0601 Orthopnea: Secondary | ICD-10-CM | POA: Diagnosis not present

## 2019-07-31 ENCOUNTER — Other Ambulatory Visit: Payer: Self-pay | Admitting: General Practice

## 2019-07-31 MED ORDER — SERTRALINE HCL 25 MG PO TABS: 25.0000 mg | ORAL_TABLET | Freq: Every day | ORAL | 1 refills | Status: DC

## 2019-08-01 ENCOUNTER — Ambulatory Visit (INDEPENDENT_AMBULATORY_CARE_PROVIDER_SITE_OTHER): Payer: 59

## 2019-08-01 ENCOUNTER — Other Ambulatory Visit: Payer: Self-pay

## 2019-08-01 DIAGNOSIS — E119 Type 2 diabetes mellitus without complications: Secondary | ICD-10-CM | POA: Diagnosis not present

## 2019-08-01 DIAGNOSIS — E538 Deficiency of other specified B group vitamins: Secondary | ICD-10-CM | POA: Diagnosis not present

## 2019-08-01 MED ORDER — CYANOCOBALAMIN 1000 MCG/ML IJ SOLN
1000.0000 ug | Freq: Once | INTRAMUSCULAR | Status: AC
  Administered 2019-08-01: 1000 ug via INTRAMUSCULAR

## 2019-08-01 NOTE — Progress Notes (Addendum)
Cassie Rich 44 y.o. female presents to office today for her first B12 injection per [name], MD. Administered CYANOCOBALAMIN 1,000 mcg/mL IM left arm. Patient tolerated well. Aware to return next week for her second injection.   Above order is mine.  [name], MD

## 2019-08-02 LAB — BASIC METABOLIC PANEL
BUN: 7 mg/dL (ref 6–23)
CO2: 25 mEq/L (ref 19–32)
Calcium: 9.6 mg/dL (ref 8.4–10.5)
Chloride: 101 mEq/L (ref 96–112)
Creatinine, Ser: 0.83 mg/dL (ref 0.40–1.20)
GFR: 90.4 mL/min (ref >60.00)
Glucose, Bld: 147 mg/dL — ABNORMAL HIGH (ref 70–99)
Potassium: 4.3 mEq/L (ref 3.5–5.1)
Sodium: 135 mEq/L (ref 135–145)

## 2019-08-02 NOTE — Addendum Note (Signed)
Addended by: Midge Minium on: 08/02/2019 07:51 AM   Modules accepted: Level of Service

## 2019-08-08 ENCOUNTER — Other Ambulatory Visit: Payer: Self-pay

## 2019-08-08 ENCOUNTER — Ambulatory Visit (INDEPENDENT_AMBULATORY_CARE_PROVIDER_SITE_OTHER): Payer: 59

## 2019-08-08 DIAGNOSIS — E538 Deficiency of other specified B group vitamins: Secondary | ICD-10-CM

## 2019-08-08 MED ORDER — CYANOCOBALAMIN 1000 MCG/ML IJ SOLN
1000.0000 ug | Freq: Once | INTRAMUSCULAR | Status: AC
  Administered 2019-08-08: 1000 ug via INTRAMUSCULAR

## 2019-08-08 NOTE — Progress Notes (Addendum)
44 y.o. female presents to the office today for her 3rd weekly CYANOCOBALAMIN 1000 mcg/mL injection. Patient received 75mL of Cyanocobalamin(Lot # [id number] Exp AUG/2022) given IM in the left deltoid. Patient tolerated well and left the office in good condition. Patient is already scheduled for her next injection.  The above order is mine.  [name], MD

## 2019-08-09 NOTE — Addendum Note (Signed)
Addended by: Midge Minium on: 08/09/2019 11:20 AM   Modules accepted: Level of Service

## 2019-08-15 ENCOUNTER — Ambulatory Visit: Payer: 59

## 2019-08-18 ENCOUNTER — Ambulatory Visit (INDEPENDENT_AMBULATORY_CARE_PROVIDER_SITE_OTHER): Payer: 59 | Admitting: Neurology

## 2019-08-18 ENCOUNTER — Other Ambulatory Visit: Payer: Self-pay

## 2019-08-18 ENCOUNTER — Encounter: Payer: Self-pay | Admitting: Neurology

## 2019-08-18 VITALS — BP 161/97 | HR 89 | Resp 20 | Ht 66.0 in | Wt 235.0 lb

## 2019-08-18 DIAGNOSIS — R202 Paresthesia of skin: Secondary | ICD-10-CM | POA: Diagnosis not present

## 2019-08-18 DIAGNOSIS — E538 Deficiency of other specified B group vitamins: Secondary | ICD-10-CM

## 2019-08-18 NOTE — Progress Notes (Signed)
[facility] Clinic Note - Initial Visit   Date: 08/18/19  Cassie Rich MRN: [id number] DOB: 09-Apr-1976   Dear Dr. Birdie Riddle:  Thank you for your kind referral of Cassie Rich for consultation of numbness/tingling. Although her history is well known to you, please allow [place] to reiterate it for the purpose of our medical record. The patient was accompanied to the clinic by self.  History of Present Illness: Cassie Rich is a 44 y.o. right-handed female with diabetes mellitus, hyperlipidemia, and depression presenting for evaluation of numbness and tingling on the left side.  Starting around late 2020, she began having spells of numbness/tingling involving the left face, arm, and leg which was constant. Over the past month, symptoms have become intermittent.  It occurs about 2 times per week, lasting 2-3 days.  Symptoms are not exacerbated by stress, activity, or foods.  She notices it more at night time.  She was found to have vitamin B12 deficiency and started injection about a month ago. She denies being vegan/vegetarian and drinks 2-3 glasses of wine on the weekend only.   She works from home for [employer].  She lives at home with husband and daughter.  She drinks 2-3 glasses of wine on the weekends.  Out-side paper records, electronic medical record, and images have been reviewed where available and summarized as:  Lab Results  Component Value Date   HGBA1C 7.9 (H) 07/17/2019   Lab Results  Component Value Date   VITAMINB12 209 (L) 07/17/2019   Lab Results  Component Value Date   TSH 1.79 07/17/2019    Past Medical History:  Diagnosis Date  . Diabetes mellitus without complication ([place]) XX123456  . DVT (deep venous thrombosis) ([place])   . GERD (gastroesophageal reflux disease)   . Hemorrhoids     Past Surgical History:  Procedure Laterality Date  . TUBAL LIGATION       Medications:  Outpatient Encounter Medications as of  08/18/2019  Medication Sig  . atorvastatin (LIPITOR) 10 MG tablet Take 1 tablet (10 mg total) by mouth daily.  . Cholecalciferol (VITAMIN D) 50 MCG (2000 UT) CAPS Take 2,000 Units by mouth.  Marland Kitchen ibuprofen (ADVIL,MOTRIN) 200 MG tablet Take 200 mg by mouth as needed.  . penicillin v potassium (VEETID) 500 MG tablet Take 500 mg by mouth 4 (four) times daily.  . metFORMIN (GLUCOPHAGE) 500 MG tablet Take 1 tablet (500 mg total) by mouth 2 (two) times daily with a meal. (Patient not taking: Reported on 08/18/2019)  . methocarbamol (ROBAXIN) 500 MG tablet Take 1 tablet (500 mg total) by mouth every 8 (eight) hours as needed for muscle spasms. (Patient not taking: Reported on 07/17/2019)  . omeprazole (PRILOSEC) 40 MG capsule Take 1 capsule (40 mg total) by mouth daily. (Patient not taking: Reported on 08/18/2019)  . sertraline (ZOLOFT) 25 MG tablet Take 1 tablet (25 mg total) by mouth daily. (Patient not taking: Reported on 08/18/2019)  . traMADol (ULTRAM) 50 MG tablet Take by mouth every 6 (six) hours as needed.  . Vitamin D, Ergocalciferol, (DRISDOL) 1.25 MG (50000 UNIT) CAPS capsule Take 1 capsule (50,000 Units total) by mouth every 7 (seven) days. (Patient not taking: Reported on 08/18/2019)   No facility-administered encounter medications on file as of 08/18/2019.    Allergies: No Known Allergies  Family History: Family History  Problem Relation Age of Onset  . Diabetes Mother   . Breast cancer Neg Hx     Social History:  Social History   Tobacco Use  . Smoking status: Never Smoker  . Smokeless tobacco: Never Used  Substance Use Topics  . Alcohol use: Yes    Comment: 2 glasses of wine per weekend  . Drug use: No   Social History   Social History Narrative   Right handed   One story apartment   No caffeine, occ soda    Vital Signs:  BP (!) 161/97   Pulse 89   Resp 20   Ht 5\' 6"  (1.676 m)   Wt 235 lb (106.6 kg)   SpO2 100%   BMI 37.93 kg/m   Neurological Exam: MENTAL STATUS  including orientation to time, place, person, recent and remote memory, attention span and concentration, language, and fund of knowledge is normal.  Speech is not dysarthric.  CRANIAL NERVES: II:  No visual field defects.  III-IV-VI: Pupils equal round and reactive to light.  Normal conjugate, extra-ocular eye movements in all directions of gaze.  No nystagmus.  No ptosis.   V:  Normal facial sensation.    VII:  Normal facial symmetry and movements.   VIII:  Normal hearing and vestibular function.   IX-X:  Normal palatal movement.   XI:  Normal shoulder shrug and head rotation.   XII:  Normal tongue strength and range of motion, no deviation or fasciculation.  MOTOR:  No atrophy, fasciculations or abnormal movements.  No pronator drift.   Upper Extremity:  Right  Left  Deltoid  5/5   5/5   Biceps  5/5   5/5   Triceps  5/5   5/5   Infraspinatus 5/5  5/5  Medial pectoralis 5/5  5/5  Wrist extensors  5/5   5/5   Wrist flexors  5/5   5/5   Finger extensors  5/5   5/5   Finger flexors  5/5   5/5   Dorsal interossei  5/5   5/5   Abductor pollicis  5/5   5/5   Tone (Ashworth scale)  0  0   Lower Extremity:  Right  Left  Hip flexors  5/5   5/5   Hip extensors  5/5   5/5   Adductor 5/5  5/5  Abductor 5/5  5/5  Knee flexors  5/5   5/5   Knee extensors  5/5   5/5   Dorsiflexors  5/5   5/5   Plantarflexors  5/5   5/5   Toe extensors  5/5   5/5   Toe flexors  5/5   5/5   Tone (Ashworth scale)  0  0   MSRs:  Right        Left                  brachioradialis 2+  2+  biceps 2+  2+  triceps 2+  2+  patellar 2+  2+  ankle jerk 2+  2+  Hoffman no  no  plantar response down  down   SENSORY:  Normal and symmetric perception of light touch, pinprick, vibration, and proprioception.  Romberg's sign absent.   COORDINATION/GAIT: Normal finger-to- nose-finger.  Intact rapid alternating movements bilaterally.  Able to rise from a chair without using arms.  Gait narrow based and stable.  Tandem and stressed gait intact.    IMPRESSION: Paresthesias, sporadic and improving.  I suspect symptoms may be contributed by vitamin B12 deficiency as her improvement correlates with starting supplementation.  Patient reassured that I do not appreciate any  abnormalities on her exam to warrant further testing.  If symptoms do not improve or she gets new neurological symptoms, MRI brain can be ordered.  Continue B12 injections through PCP's office as scheduled.   Thank you for allowing me to participate in patient's care.  If I can answer any additional questions, I would be pleased to do so.    Sincerely,    Derell Bruun K. Posey Pronto, DO

## 2019-08-18 NOTE — Patient Instructions (Signed)
Your symptoms are most likely due to low vitamin B12.  Continue your injections and it should continue to improve.

## 2019-08-22 ENCOUNTER — Ambulatory Visit (INDEPENDENT_AMBULATORY_CARE_PROVIDER_SITE_OTHER): Payer: 59

## 2019-08-22 DIAGNOSIS — E538 Deficiency of other specified B group vitamins: Secondary | ICD-10-CM | POA: Diagnosis not present

## 2019-08-22 MED ORDER — CYANOCOBALAMIN 1000 MCG/ML IJ SOLN
1000.0000 ug | Freq: Once | INTRAMUSCULAR | Status: AC
  Administered 2019-08-22: 1000 ug via INTRAMUSCULAR

## 2019-08-22 NOTE — Progress Notes (Addendum)
44 y.o. female presents to the office today for her 3rd weekly CYANOCOBALAMIN 1000 mcg/mL injection. Patient received 30mL of Cyanocobalamin(Lot # [id number] Exp AUG/2022) given IM in the left deltoid. Patient tolerated well and left the office in good condition. Patient is aware to schedule her 4th B12 shot.  The above order is mine.  [name], MD

## 2019-08-23 NOTE — Addendum Note (Signed)
Addended by: Midge Minium on: 08/23/2019 07:58 AM   Modules accepted: Level of Service

## 2019-08-29 ENCOUNTER — Ambulatory Visit (INDEPENDENT_AMBULATORY_CARE_PROVIDER_SITE_OTHER): Payer: 59

## 2019-08-29 ENCOUNTER — Other Ambulatory Visit: Payer: Self-pay

## 2019-08-29 DIAGNOSIS — E538 Deficiency of other specified B group vitamins: Secondary | ICD-10-CM

## 2019-08-29 MED ORDER — CYANOCOBALAMIN 1000 MCG/ML IJ SOLN
1000.0000 ug | Freq: Once | INTRAMUSCULAR | Status: AC
  Administered 2019-08-29: 1000 ug via INTRAMUSCULAR

## 2019-08-29 NOTE — Progress Notes (Signed)
44 y.o. female presents to the office today forher 4th weekly CYANOCOBALAMIN 1000 mcg/mL injection. Patient received 72mL of Cyanocobalamin(Lot # [id number] Exp AUG/2022) given IM in the left deltoid. Patient tolerated well and left the office in good condition. Patient is aware to schedule her 1st monthly B12 injection.

## 2019-09-04 NOTE — Progress Notes (Signed)
Reviewing LPN note and PCP absent.  B12 injection given as directed by PCP.  Leeanne Rio, PA-C

## 2019-09-29 ENCOUNTER — Ambulatory Visit: Payer: 59

## 2019-12-08 LAB — HM DIABETES EYE EXAM

## 2020-02-08 ENCOUNTER — Encounter: Payer: Self-pay | Admitting: Family Medicine

## 2020-02-08 ENCOUNTER — Other Ambulatory Visit (INDEPENDENT_AMBULATORY_CARE_PROVIDER_SITE_OTHER): Payer: 59

## 2020-02-08 ENCOUNTER — Other Ambulatory Visit: Payer: Self-pay

## 2020-02-08 ENCOUNTER — Ambulatory Visit (INDEPENDENT_AMBULATORY_CARE_PROVIDER_SITE_OTHER): Payer: 59 | Admitting: Family Medicine

## 2020-02-08 VITALS — BP 135/86 | HR 84 | Temp 98.0°F | Resp 16 | Ht 66.0 in | Wt 230.1 lb

## 2020-02-08 DIAGNOSIS — E119 Type 2 diabetes mellitus without complications: Secondary | ICD-10-CM | POA: Diagnosis not present

## 2020-02-08 DIAGNOSIS — M791 Myalgia, unspecified site: Secondary | ICD-10-CM

## 2020-02-08 DIAGNOSIS — R200 Anesthesia of skin: Secondary | ICD-10-CM

## 2020-02-08 DIAGNOSIS — Z0000 Encounter for general adult medical examination without abnormal findings: Secondary | ICD-10-CM | POA: Diagnosis not present

## 2020-02-08 DIAGNOSIS — E559 Vitamin D deficiency, unspecified: Secondary | ICD-10-CM

## 2020-02-08 MED ORDER — SERTRALINE HCL 25 MG PO TABS
25.0000 mg | ORAL_TABLET | Freq: Every day | ORAL | 3 refills | Status: DC
Start: 2020-02-08 — End: 2020-10-15

## 2020-02-08 NOTE — Progress Notes (Signed)
Subjective:    Patient ID: Cassie Rich, female    DOB: [birth year], 44 y.o.   MRN: [id number]  HPI CPE- UTD on pap, mammo, foot exam, eye exam.  UTD on COVID vaccines.  Due for urine micro, A1C    Reviewed past medical, surgical, family and social histories.   Health Maintenance  Topic Date Due  . HEMOGLOBIN A1C  01/14/2020  . URINE MICROALBUMIN  03/28/2020  . INFLUENZA VACCINE  08/29/2020 (Originally 12/31/2019)  . PNEUMOCOCCAL POLYSACCHARIDE VACCINE AGE 6-64 HIGH RISK  02/07/2021 (Originally 09/03/1977)  . Hepatitis C Screening  02/07/2021 (Originally 12-Feb-1976)  . HIV Screening  02/07/2021 (Originally 09/04/1990)  . TETANUS/TDAP  03/06/2020  . MAMMOGRAM  04/09/2020  . FOOT EXAM  07/16/2020  . OPHTHALMOLOGY EXAM  12/07/2020  . PAP SMEAR-Modifier  04/09/2022  . COVID-19 Vaccine  Completed      Review of Systems Patient reports no vision/ hearing changes, adenopathy,fever, weight change,  persistant/recurrent hoarseness , swallowing issues, chest pain, palpitations, edema, persistant/recurrent cough, hemoptysis, dyspnea (rest/exertional/paroxysmal nocturnal), gastrointestinal bleeding (melena, rectal bleeding), abdominal pain, significant heartburn, bowel changes, GU symptoms (dysuria, hematuria, incontinence), Gyn symptoms (abnormal  bleeding, pain),  syncope, focal weakness, memory loss, skin/hair/nail changes, abnormal bruising or bleeding, anxiety, or depression.   + L sided pain w/ L arm swelling, muscle tenderness, numbness  This visit occurred during the SARS-CoV-2 public health emergency.  Safety protocols were in place, including screening questions prior to the visit, additional usage of staff PPE, and extensive cleaning of exam room while observing appropriate contact time as indicated for disinfecting solutions.       Objective:   Physical Exam General Appearance:    Alert, cooperative, no distress, appears stated age  Head:    Normocephalic, without obvious  abnormality, atraumatic  Eyes:    PERRL, conjunctiva/corneas clear, EOM's intact, fundi    benign, both eyes  Ears:    Normal TM's and external ear canals, both ears  Nose:   Deferred due to COVID  Throat:   Neck:   Supple, symmetrical, trachea midline, no adenopathy;    Thyroid: no enlargement/tenderness/nodules  Back:     Symmetric, no curvature, ROM normal, no CVA tenderness  Lungs:     Clear to auscultation bilaterally, respirations unlabored  Chest Wall:    No tenderness or deformity   Heart:    Regular rate and rhythm, S1 and S2 normal, no murmur, rub   or gallop  Breast Exam:    Deferred to GYN  Abdomen:     Soft, non-tender, bowel sounds active all four quadrants,    no masses, no organomegaly  Genitalia:    Deferred to GYN  Rectal:    Extremities:   L arm mildly larger than R, no LE edema  Pulses:   2+ and symmetric all extremities  Skin:   Skin color, texture, turgor normal, no rashes or lesions  Lymph nodes:   Cervical, supraclavicular, and axillary nodes normal  Neurologic:   CNII-XII intact, normal strength, sensation and reflexes    throughout          Assessment & Plan:  Myalgia/L sided numbness- ongoing issue for pt.  She saw Dr Posey Pronto (neurology) and no cause identified.  Will check ANA and ESR and refer to Rheumatology for complete evaluation.

## 2020-02-08 NOTE — Patient Instructions (Addendum)
Please schedule a lab appt at your convenience or go to our [place] at [facility] (no appt needed) Follow up in 3-4 months to recheck sugars We'll notify you of your lab results and make any changes if needed Continue to work on healthy diet and regular exercise- you can do it! We'll call you with Rheumatology appt Call with any questions or concerns Stay Safe!  Stay Healthy!

## 2020-02-09 ENCOUNTER — Other Ambulatory Visit: Payer: Self-pay | Admitting: General Practice

## 2020-02-09 LAB — MICROALBUMIN / CREATININE URINE RATIO
Creatinine, Urine: 144 mg/dL (ref 20–275)
Microalb Creat Ratio: 26 mcg/mg creat (ref <30)
Microalb, Ur: 3.8 mg/dL

## 2020-02-09 MED ORDER — VITAMIN D (ERGOCALCIFEROL) 1.25 MG (50000 UNIT) PO CAPS: 50000.0000 [IU] | ORAL_CAPSULE | ORAL | 0 refills | Status: DC

## 2020-02-09 MED ORDER — METFORMIN HCL 1000 MG PO TABS: 1000.0000 mg | ORAL_TABLET | Freq: Two times a day (BID) | ORAL | 1 refills | Status: DC

## 2020-02-10 LAB — CBC WITH DIFFERENTIAL/PLATELET
Absolute Monocytes: 489 {cells}/uL (ref 200–950)
Basophils Absolute: 42 {cells}/uL (ref 0–200)
Basophils Relative: 0.4 %
Eosinophils Absolute: 52 {cells}/uL (ref 15–500)
Eosinophils Relative: 0.5 %
HCT: 37.8 % (ref 35.0–45.0)
Hemoglobin: 12.1 g/dL (ref 11.7–15.5)
Lymphs Abs: 2954 {cells}/uL (ref 850–3900)
MCH: 24.5 pg — ABNORMAL LOW (ref 27.0–33.0)
MCHC: 32 g/dL (ref 32.0–36.0)
MCV: 76.7 fL — ABNORMAL LOW (ref 80.0–100.0)
MPV: 9.4 fL (ref 7.5–12.5)
Monocytes Relative: 4.7 %
Neutro Abs: 6864 {cells}/uL (ref 1500–7800)
Neutrophils Relative %: 66 %
Platelets: 432 Thousand/uL — ABNORMAL HIGH (ref 140–400)
RBC: 4.93 Million/uL (ref 3.80–5.10)
RDW: 14.6 % (ref 11.0–15.0)
Total Lymphocyte: 28.4 %
WBC: 10.4 Thousand/uL (ref 3.8–10.8)

## 2020-02-10 LAB — BASIC METABOLIC PANEL
BUN: 8 mg/dL (ref 7–25)
CO2: 26 mmol/L (ref 20–32)
Calcium: 9.6 mg/dL (ref 8.6–10.2)
Chloride: 104 mmol/L (ref 98–110)
Creat: 0.79 mg/dL (ref 0.50–1.10)
Glucose, Bld: 114 mg/dL — ABNORMAL HIGH (ref 65–99)
Potassium: 4.3 mmol/L (ref 3.5–5.3)
Sodium: 138 mmol/L (ref 135–146)

## 2020-02-10 LAB — ANA: Anti Nuclear Antibody (ANA): NEGATIVE

## 2020-02-10 LAB — HEMOGLOBIN A1C
Hgb A1c MFr Bld: 8 %{Hb} — ABNORMAL HIGH (ref <5.7)
Mean Plasma Glucose: 183 (calc)
eAG (mmol/L): 10.1 (calc)

## 2020-02-10 LAB — LIPID PANEL
Cholesterol: 142 mg/dL (ref <200)
HDL: 37 mg/dL — ABNORMAL LOW (ref >=50)
LDL Cholesterol (Calc): 86 mg/dL (calc)
Non-HDL Cholesterol (Calc): 105 mg/dL (calc) (ref <130)
Total CHOL/HDL Ratio: 3.8 (calc) (ref <5.0)
Triglycerides: 99 mg/dL (ref <150)

## 2020-02-10 LAB — HEPATIC FUNCTION PANEL
AG Ratio: 1.3 (calc) (ref 1.0–2.5)
ALT: 13 U/L (ref 6–29)
AST: 20 U/L (ref 10–30)
Albumin: 4.5 g/dL (ref 3.6–5.1)
Alkaline phosphatase (APISO): 94 U/L (ref 31–125)
Bilirubin, Direct: 0.2 mg/dL (ref 0.0–0.2)
Globulin: 3.6 g/dL (calc) (ref 1.9–3.7)
Indirect Bilirubin: 0.6 mg/dL (calc) (ref 0.2–1.2)
Total Bilirubin: 0.8 mg/dL (ref 0.2–1.2)
Total Protein: 8.1 g/dL (ref 6.1–8.1)

## 2020-02-10 LAB — VITAMIN D 25 HYDROXY (VIT D DEFICIENCY, FRACTURES): Vit D, 25-Hydroxy: 28 ng/mL — ABNORMAL LOW (ref 30–100)

## 2020-02-10 LAB — SEDIMENTATION RATE: Sed Rate: 19 mm/h (ref 0–20)

## 2020-02-10 LAB — TSH: TSH: 1.76 mIU/L

## 2020-02-23 ENCOUNTER — Telehealth: Payer: Self-pay | Admitting: Family Medicine

## 2020-02-23 NOTE — Telephone Encounter (Signed)
..  Medication Refills  Medication:  She thinks it is Lotrisome cream - she said it is for her feet   Pharmacy: [facility] [place]  ** Let patient know to contact pharmacy at the end of the day to make sure medication is ready.**  ** Please notify patient to allow 48-72 hours to process.**  ** Encourage patient to contact the pharmacy for refills or they can request refills through [facility]**  Clinical Fills out below:   Last refill:  QTY:  Refill Date:    Other Comments:   Okay for refill?  Please advise.

## 2020-02-25 NOTE — Assessment & Plan Note (Signed)
Chronic problem.  UTD on foot exam, eye exam.  Will get microalbumin and A1C.

## 2020-02-25 NOTE — Assessment & Plan Note (Signed)
Pt's PE WNL w/ exception of obesity and mild L arm hypertrophy.  UTD on pap, mammo, COVID vaccines.  Check labs.  Anticipatory guidance provided.

## 2020-02-25 NOTE — Assessment & Plan Note (Signed)
Check labs and replete prn. 

## 2020-02-27 MED ORDER — CLOTRIMAZOLE-BETAMETHASONE 1-0.05 % EX CREA: 1.0000 "application " | TOPICAL_CREAM | Freq: Two times a day (BID) | CUTANEOUS | 1 refills | Status: DC

## 2020-02-27 NOTE — Telephone Encounter (Signed)
Ok for refill? 

## 2020-02-27 NOTE — Telephone Encounter (Signed)
Please advise 

## 2020-02-27 NOTE — Telephone Encounter (Signed)
Patient has called back in stating she has found the name of the medication she would like refilled.  States this medication is Clotrimazole cream 1%  States this cream was used for skin peeling in between her toes.  States she is going to [facility] on Monday and would like to have cream started before she leaves.    Please advise if patient needs appointment or if medication can be sent to the pharmacy.

## 2020-02-27 NOTE — Telephone Encounter (Signed)
Medication filled to pharmacy as requested.   

## 2020-04-16 ENCOUNTER — Telehealth: Payer: Self-pay | Admitting: Family Medicine

## 2020-04-16 NOTE — Telephone Encounter (Signed)
Patient would like you to send her referral back in to the rheumotologist - she said she has time to go now.

## 2020-04-17 NOTE — Telephone Encounter (Signed)
Called patient to give number to [facility] to set up her referral appointment.

## 2020-05-03 ENCOUNTER — Other Ambulatory Visit: Payer: Self-pay | Admitting: Family Medicine

## 2020-05-13 ENCOUNTER — Ambulatory Visit: Payer: 59 | Admitting: Family Medicine

## 2020-05-13 DIAGNOSIS — Z0289 Encounter for other administrative examinations: Secondary | ICD-10-CM

## 2020-05-17 ENCOUNTER — Ambulatory Visit: Payer: 59 | Admitting: Internal Medicine

## 2020-08-05 IMAGING — MG MM DIGITAL DIAGNOSTIC UNILAT*L* W/ TOMO W/ CAD
6 of 10 series · 6 of 30 positions shown · non-contrast
Comparison: Previous exam(s).

CLINICAL DATA: Left axillary pulling sensations when lying on her
left side. Intermittent left axillary swelling for the past 2
months.

EXAM:
DIGITAL DIAGNOSTIC LEFT MAMMOGRAM WITH CAD AND TOMO
ULTRASOUND LEFT AXILLA

[L MLO synth-2D (1 of 2)]
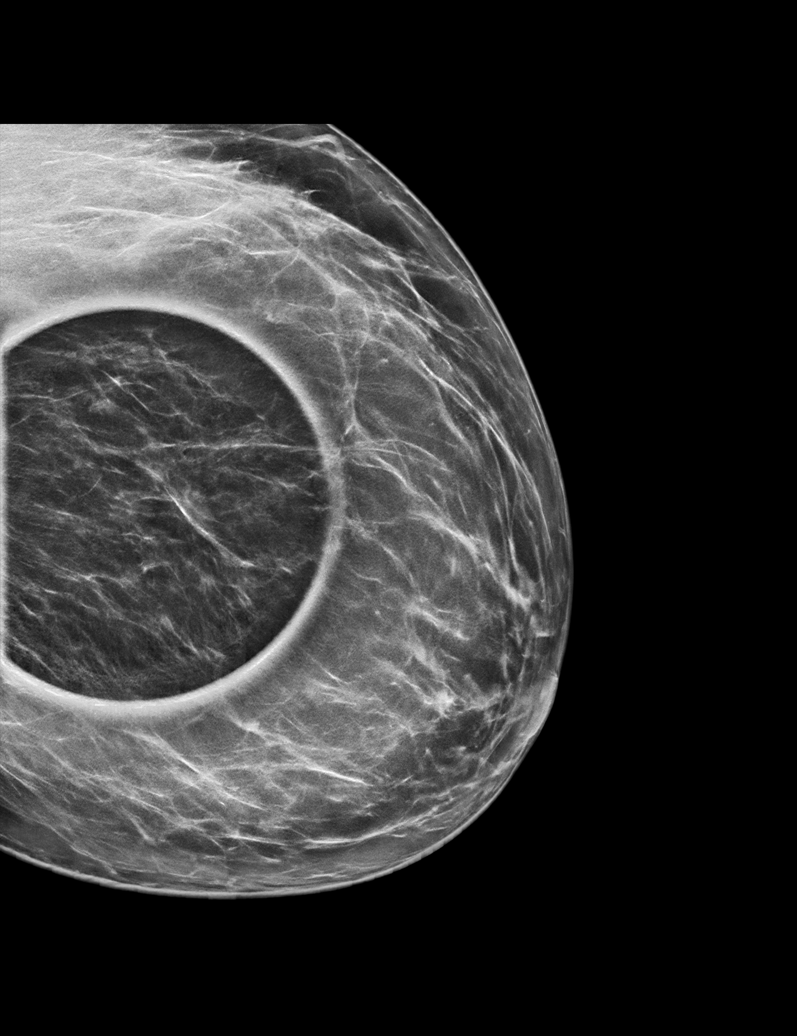

[L ML synth-2D]
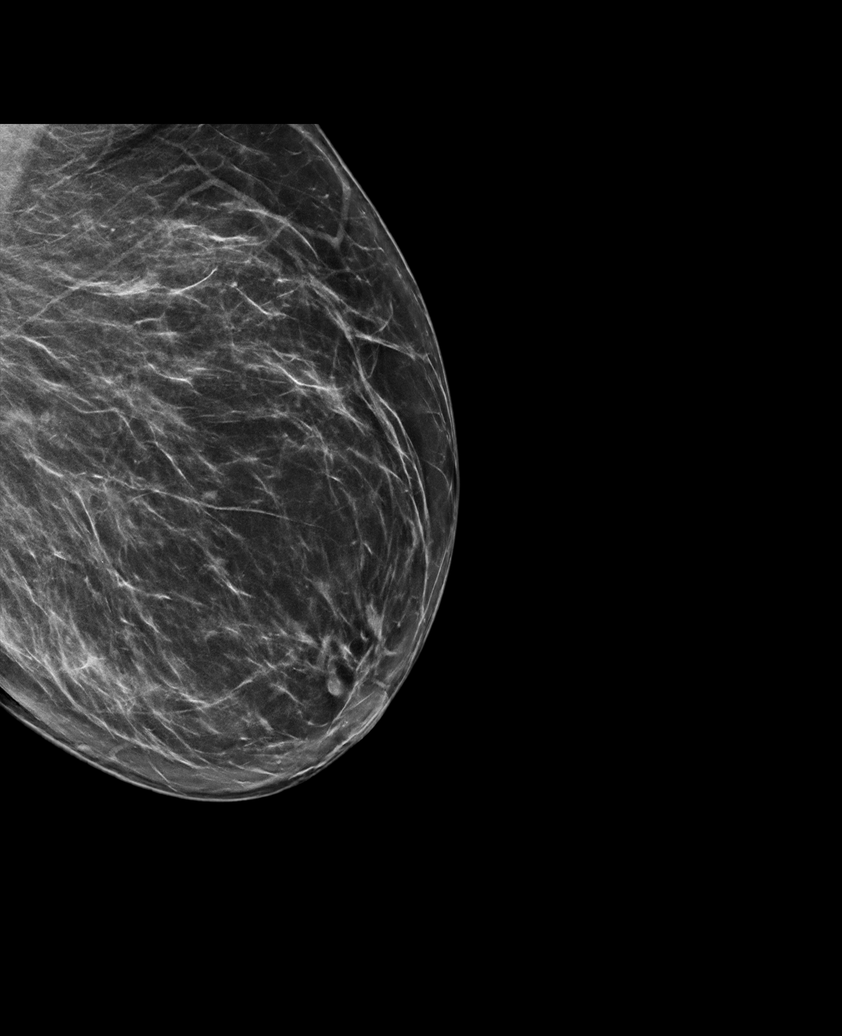

[L TAN synth-2D]
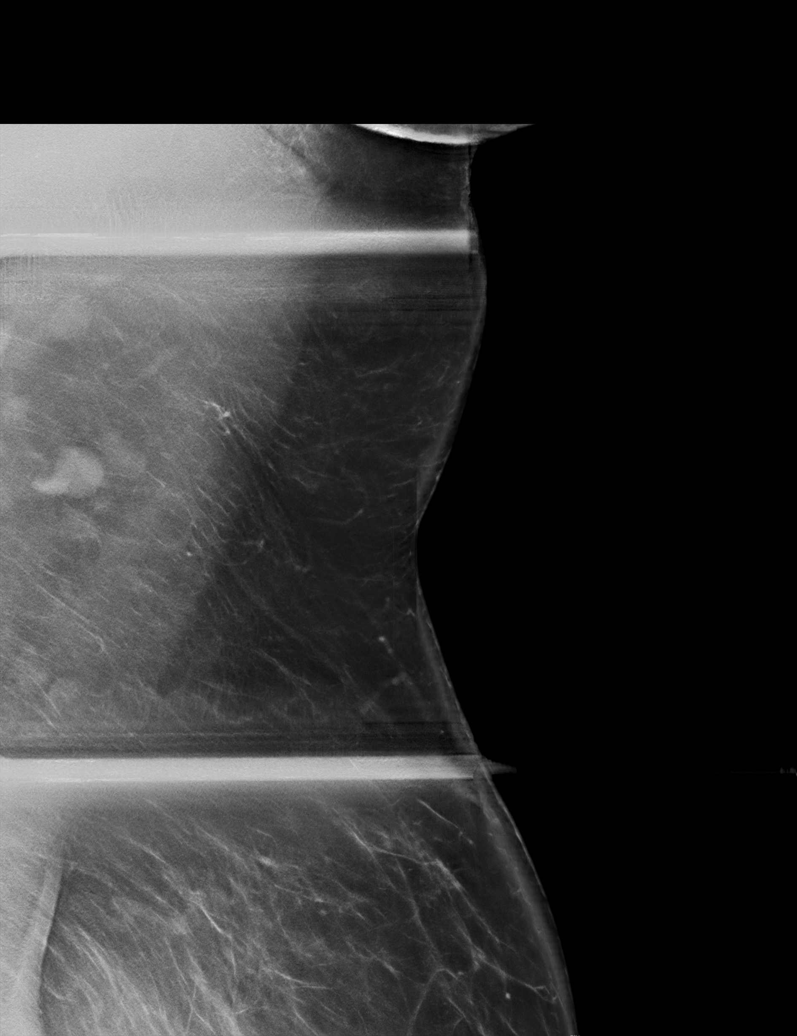

[L CC synth-2D]
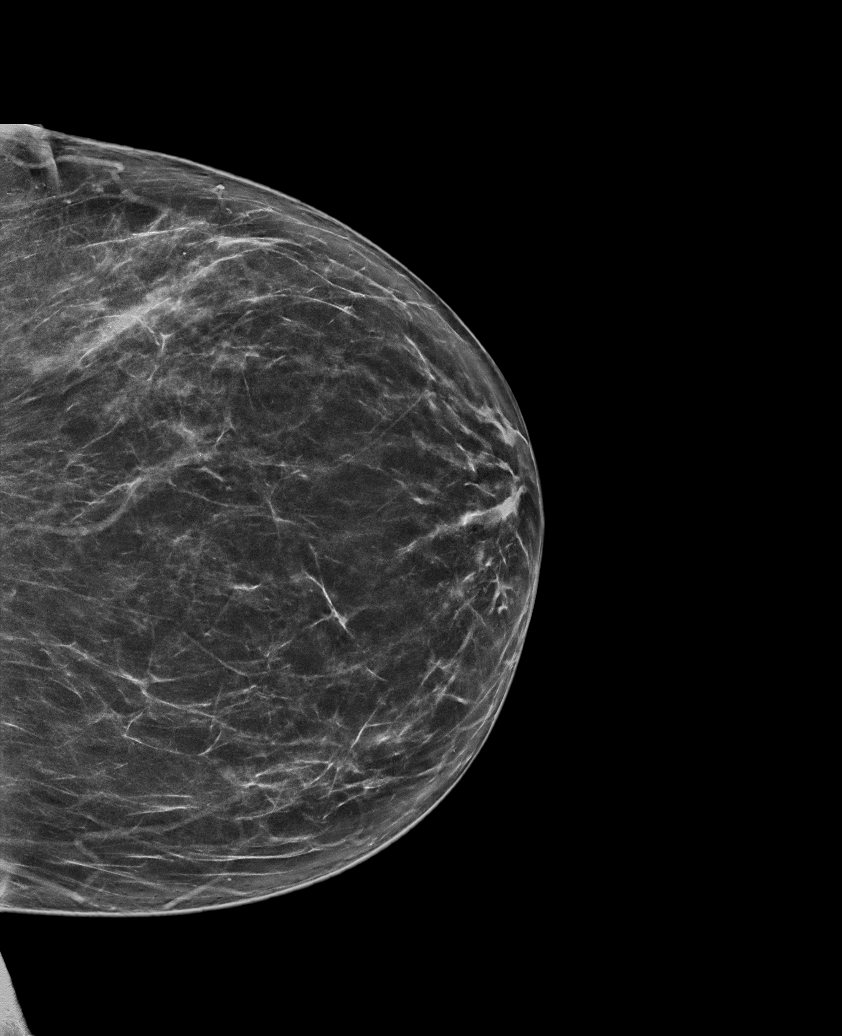

[L MLO synth-2D (2 of 2)]
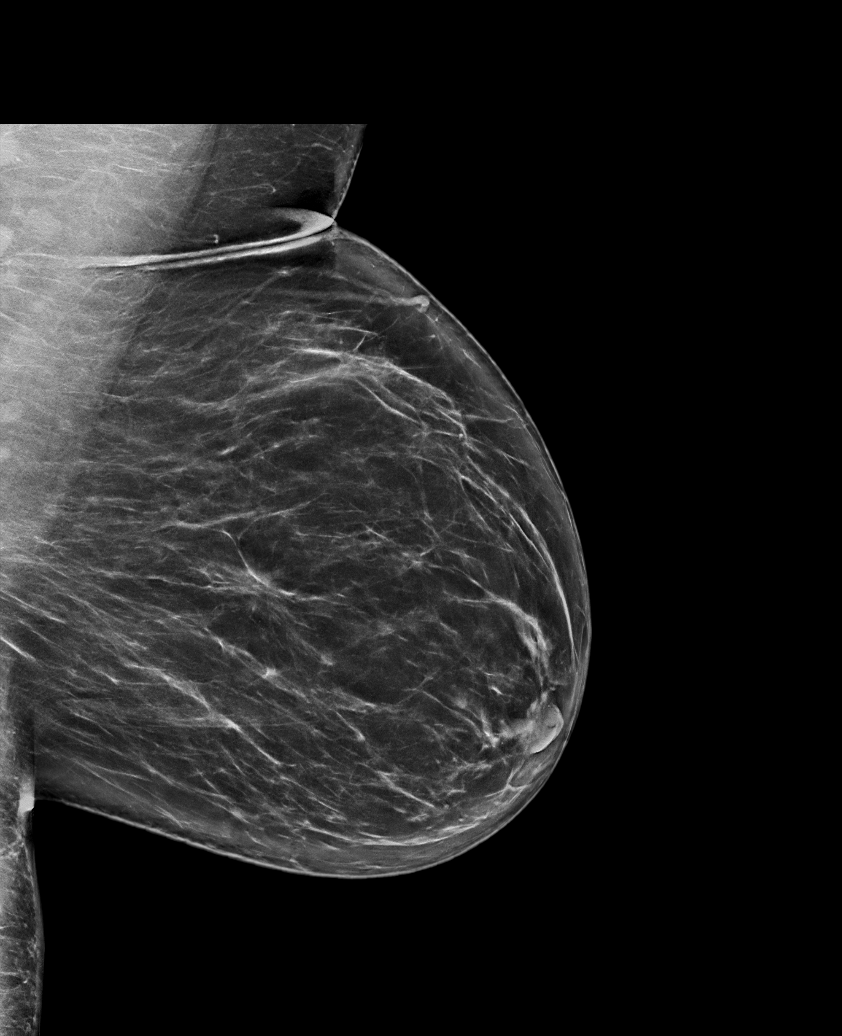

[L CC tomo · tomo slice 39/76.0]
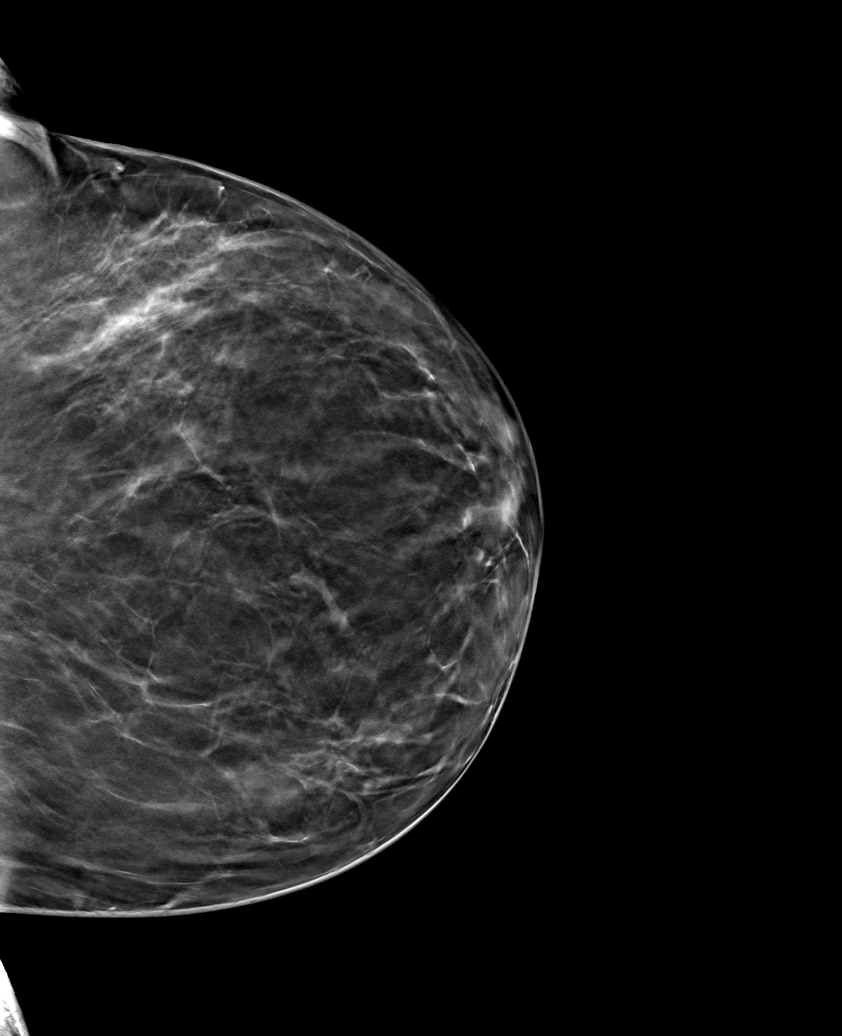

[6 of 30 positions shown; findings below may reference images not displayed]

ACR Breast Density Category b: There are scattered areas of
fibroglandular density.
FINDINGS: Stable mammographic appearance of the left breast with no findings
suspicious for malignancy. No abnormality demonstrated in the left
axilla at the location patient concern.

Mammographic images were processed with CAD.

On physical exam, there is a prominent axillary fat pad
corresponding to the swelling reported by the patient. There are no
palpable left axillary lymph nodes.

Targeted ultrasound is performed, showing normal appearing left
axillary lymph nodes and subcutaneous fatty tissue. No mass or other
abnormalities are seen.
IMPRESSION: No evidence of malignancy.

RECOMMENDATION:
Bilateral diagnostic mammogram in 9 months when due. That will be 1
year since mammographic evaluation of the right breast.

I have discussed the findings and recommendations with the patient.
If applicable, a reminder letter will be sent to the patient
regarding the next appointment.

BI-RADS CATEGORY  1: Negative.

## 2020-08-29 ENCOUNTER — Other Ambulatory Visit: Payer: Self-pay | Admitting: Family Medicine

## 2020-08-29 DIAGNOSIS — Z09 Encounter for follow-up examination after completed treatment for conditions other than malignant neoplasm: Secondary | ICD-10-CM

## 2020-08-29 DIAGNOSIS — R921 Mammographic calcification found on diagnostic imaging of breast: Secondary | ICD-10-CM

## 2020-09-26 ENCOUNTER — Other Ambulatory Visit: Payer: Self-pay

## 2020-09-26 ENCOUNTER — Other Ambulatory Visit: Payer: Self-pay | Admitting: Family Medicine

## 2020-09-26 ENCOUNTER — Ambulatory Visit
Admission: RE | Admit: 2020-09-26 | Discharge: 2020-09-26 | Disposition: A | Discharge status: Home / Self Care | Payer: 59 | Source: Ambulatory Visit | Attending: Family Medicine | Admitting: Family Medicine

## 2020-09-26 DIAGNOSIS — R921 Mammographic calcification found on diagnostic imaging of breast: Secondary | ICD-10-CM

## 2020-10-03 ENCOUNTER — Ambulatory Visit
Admission: RE | Admit: 2020-10-03 | Discharge: 2020-10-03 | Disposition: A | Discharge status: Home / Self Care | Payer: 59 | Source: Ambulatory Visit | Attending: Family Medicine | Admitting: Family Medicine

## 2020-10-03 ENCOUNTER — Other Ambulatory Visit: Payer: Self-pay | Admitting: Family Medicine

## 2020-10-03 ENCOUNTER — Other Ambulatory Visit: Payer: Self-pay

## 2020-10-03 DIAGNOSIS — R921 Mammographic calcification found on diagnostic imaging of breast: Secondary | ICD-10-CM

## 2020-10-15 ENCOUNTER — Encounter: Payer: Self-pay | Admitting: Family Medicine

## 2020-10-15 ENCOUNTER — Other Ambulatory Visit: Payer: Self-pay

## 2020-10-15 ENCOUNTER — Ambulatory Visit (INDEPENDENT_AMBULATORY_CARE_PROVIDER_SITE_OTHER): Payer: 59 | Admitting: Family Medicine

## 2020-10-15 ENCOUNTER — Other Ambulatory Visit: Payer: 59

## 2020-10-15 VITALS — BP 118/80 | HR 80 | Temp 97.5°F | Resp 18 | Ht 66.0 in | Wt 227.2 lb

## 2020-10-15 DIAGNOSIS — E119 Type 2 diabetes mellitus without complications: Secondary | ICD-10-CM | POA: Diagnosis not present

## 2020-10-15 DIAGNOSIS — Z23 Encounter for immunization: Secondary | ICD-10-CM

## 2020-10-15 DIAGNOSIS — E1169 Type 2 diabetes mellitus with other specified complication: Secondary | ICD-10-CM | POA: Diagnosis not present

## 2020-10-15 DIAGNOSIS — E785 Hyperlipidemia, unspecified: Secondary | ICD-10-CM | POA: Diagnosis not present

## 2020-10-15 LAB — CBC WITH DIFFERENTIAL/PLATELET
Basophils Absolute: 0 10*3/uL (ref 0.0–0.1)
Basophils Relative: 0.3 % (ref 0.0–3.0)
Eosinophils Absolute: 0 10*3/uL (ref 0.0–0.7)
Eosinophils Relative: 0.4 % (ref 0.0–5.0)
HCT: 37.6 % (ref 36.0–46.0)
Hemoglobin: 12.2 g/dL (ref 12.0–15.0)
Lymphocytes Relative: 29.1 % (ref 12.0–46.0)
Lymphs Abs: 2.7 10*3/uL (ref 0.7–4.0)
MCHC: 32.4 g/dL (ref 30.0–36.0)
MCV: 74.5 fl — ABNORMAL LOW (ref 78.0–100.0)
Monocytes Absolute: 0.5 10*3/uL (ref 0.1–1.0)
Monocytes Relative: 5.8 % (ref 3.0–12.0)
Neutro Abs: 6.1 10*3/uL (ref 1.4–7.7)
Neutrophils Relative %: 64.4 % (ref 43.0–77.0)
Platelets: 405 10*3/uL — ABNORMAL HIGH (ref 150.0–400.0)
RBC: 5.04 Mil/uL (ref 3.87–5.11)
RDW: 16.3 % — ABNORMAL HIGH (ref 11.5–15.5)
WBC: 9.4 10*3/uL (ref 4.0–10.5)

## 2020-10-15 LAB — BASIC METABOLIC PANEL
BUN: 9 mg/dL (ref 6–23)
CO2: 22 mEq/L (ref 19–32)
Calcium: 9.4 mg/dL (ref 8.4–10.5)
Chloride: 105 mEq/L (ref 96–112)
Creatinine, Ser: 0.7 mg/dL (ref 0.40–1.20)
GFR: 104.67 mL/min (ref >60.00)
Glucose, Bld: 141 mg/dL — ABNORMAL HIGH (ref 70–99)
Potassium: 4.1 mEq/L (ref 3.5–5.1)
Sodium: 136 mEq/L (ref 135–145)

## 2020-10-15 LAB — LIPID PANEL
Cholesterol: 142 mg/dL (ref 0–200)
HDL: 34.6 mg/dL — ABNORMAL LOW (ref >39.00)
LDL Cholesterol: 91 mg/dL (ref 0–99)
NonHDL: 107.61
Total CHOL/HDL Ratio: 4
Triglycerides: 83 mg/dL (ref 0.0–149.0)
VLDL: 16.6 mg/dL (ref 0.0–40.0)

## 2020-10-15 LAB — TSH: TSH: 1.91 u[IU]/mL (ref 0.35–4.50)

## 2020-10-15 LAB — HEPATIC FUNCTION PANEL
ALT: 14 U/L (ref 0–35)
AST: 16 U/L (ref 0–37)
Albumin: 4.3 g/dL (ref 3.5–5.2)
Alkaline Phosphatase: 87 U/L (ref 39–117)
Bilirubin, Direct: 0.2 mg/dL (ref 0.0–0.3)
Total Bilirubin: 0.9 mg/dL (ref 0.2–1.2)
Total Protein: 7.4 g/dL (ref 6.0–8.3)

## 2020-10-15 NOTE — Progress Notes (Signed)
   Subjective:    Patient ID: Cassie Rich, female    DOB: 22-Aug-1975, 45 y.o.   MRN: [id number]  HPI DM- chronic problem, on Metformin 1000mg  BID but not taking.  UTD on eye exam.  Due for foot exam.  Pt doesn't want to take Metformin based on things she has read, 'i'm scared'.  No CP, SOB, HAs, visual changes.  No numbness/tingling of hands/feet  Hyperlipidemia- chronic problem, on Lipitor 10mg  daily but not taking.  No abd pain, N/V  Obesity- pt's BMI is 36.67 but coupled w/ DM and Hyperlipidemia this qualifies as morbidly obese.  Pt has started walking recently   Review of Systems For ROS see HPI   This visit occurred during the SARS-CoV-2 public health emergency.  Safety protocols were in place, including screening questions prior to the visit, additional usage of staff PPE, and extensive cleaning of exam room while observing appropriate contact time as indicated for disinfecting solutions.       Objective:   Physical Exam Vitals reviewed.  Constitutional:      General: She is not in acute distress.    Appearance: Normal appearance. She is well-developed. She is obese.  HENT:     Head: Normocephalic and atraumatic.  Eyes:     Conjunctiva/sclera: Conjunctivae normal.     Pupils: Pupils are equal, round, and reactive to light.  Neck:     Thyroid: No thyromegaly.  Cardiovascular:     Rate and Rhythm: Normal rate and regular rhythm.     Pulses: Normal pulses.     Heart sounds: Normal heart sounds. No murmur heard.   Pulmonary:     Effort: Pulmonary effort is normal. No respiratory distress.     Breath sounds: Normal breath sounds.  Abdominal:     General: There is no distension.     Palpations: Abdomen is soft.     Tenderness: There is no abdominal tenderness.  Musculoskeletal:     Cervical back: Normal range of motion and neck supple.     Right lower leg: No edema.     Left lower leg: No edema.  Lymphadenopathy:     Cervical: No cervical adenopathy.  Skin:     General: Skin is warm and dry.  Neurological:     Mental Status: She is alert and oriented to person, place, and time.  Psychiatric:        Behavior: Behavior normal.           Assessment & Plan:

## 2020-10-15 NOTE — Addendum Note (Signed)
Addended by: Genevie Cheshire L on: 10/15/2020 08:47 AM   Modules accepted: Orders

## 2020-10-15 NOTE — Assessment & Plan Note (Signed)
Ongoing issue for pt.  LDL goal w/ diabetes is <70.  She is not taking statin as prescribed.  Check labs and start meds prn.

## 2020-10-15 NOTE — Assessment & Plan Note (Signed)
Pt's BMI is 36.67 and given her DM and hyperlipidemia this qualifies as morbidly obese.  Discussed need for healthy diet and regular exercise.  Will follow.

## 2020-10-15 NOTE — Patient Instructions (Addendum)
Schedule your complete physical in 6 months We'll notify you of your lab results and make any changes if needed Continue to work on healthy diet and regular exercise- you can do it!!! Call and schedule your eye exam and have them send me a copy of the report Call with any questions or concerns Have a great summer!!!

## 2020-10-15 NOTE — Assessment & Plan Note (Signed)
Ongoing issue for pt.  UTD on eye exam, microalbumin.  Foot exam done today.  Did not start Metformin as directed.  Doesn't want to.  Check labs and determine if other medication would be appropriate.

## 2020-10-16 ENCOUNTER — Other Ambulatory Visit: Payer: Self-pay

## 2020-10-16 LAB — HEMOGLOBIN A1C
Hgb A1c MFr Bld: 7.9 % of total Hgb — ABNORMAL HIGH (ref <5.7)
Mean Plasma Glucose: 180 mg/dL
eAG (mmol/L): 10 mmol/L

## 2020-10-16 MED ORDER — EMPAGLIFLOZIN 10 MG PO TABS: 10.0000 mg | ORAL_TABLET | Freq: Every day | ORAL | 3 refills | Status: AC

## 2020-10-30 ENCOUNTER — Telehealth: Payer: Self-pay

## 2020-10-30 NOTE — Telephone Encounter (Signed)
PA has been submitted for patient for Jardiance 10mg   30 Tabs. 1 daily before breakfast. Should hear back from [facility] within 5-7 business days.

## 2021-02-11 ENCOUNTER — Other Ambulatory Visit: Payer: Self-pay

## 2021-02-11 DIAGNOSIS — E119 Type 2 diabetes mellitus without complications: Secondary | ICD-10-CM

## 2021-02-11 MED ORDER — EMPAGLIFLOZIN 10 MG PO TABS: 10.0000 mg | ORAL_TABLET | Freq: Every day | ORAL | 3 refills | Status: DC

## 2021-02-11 NOTE — Telephone Encounter (Signed)
Patient called and has decided that she does want to go on the Jardiance that Dr. Birdie Riddle suggested several months ago.  Please send in to [facility] on [address] in [place].

## 2021-02-11 NOTE — Progress Notes (Signed)
Rdiance '10mg'$ 

## 2021-02-11 NOTE — Telephone Encounter (Signed)
Called and spoke with patient about her A1C results from 10/15/20. Per provider wanted patient to start Jardiance '10mg'$  daily. Patient called back today to inform provider that she would like to start taking the medication. I filled the Jardiance '10mg'$  and sent to patient pharmacy. Patient notified.

## 2021-04-14 ENCOUNTER — Encounter: Payer: 59 | Admitting: Family Medicine

## 2021-05-20 ENCOUNTER — Other Ambulatory Visit: Payer: Self-pay | Admitting: Obstetrics and Gynecology

## 2021-05-20 DIAGNOSIS — N923 Ovulation bleeding: Secondary | ICD-10-CM

## 2021-05-27 ENCOUNTER — Other Ambulatory Visit: Payer: 59

## 2021-06-13 ENCOUNTER — Ambulatory Visit
Admission: RE | Admit: 2021-06-13 | Discharge: 2021-06-13 | Disposition: A | Discharge status: Home / Self Care | Payer: 59 | Source: Ambulatory Visit | Attending: Obstetrics and Gynecology | Admitting: Obstetrics and Gynecology

## 2021-06-13 DIAGNOSIS — N923 Ovulation bleeding: Secondary | ICD-10-CM

## 2021-08-01 ENCOUNTER — Other Ambulatory Visit: Payer: Self-pay | Admitting: Obstetrics and Gynecology

## 2021-08-01 DIAGNOSIS — N923 Ovulation bleeding: Secondary | ICD-10-CM

## 2021-08-08 ENCOUNTER — Ambulatory Visit
Admission: RE | Admit: 2021-08-08 | Discharge: 2021-08-08 | Disposition: A | Discharge status: Home / Self Care | Payer: 59 | Source: Ambulatory Visit | Attending: Obstetrics and Gynecology | Admitting: Obstetrics and Gynecology

## 2021-08-08 DIAGNOSIS — N923 Ovulation bleeding: Secondary | ICD-10-CM

## 2021-09-09 NOTE — Progress Notes (Deleted)
46 y.o. G2P2 Married Black or [place] American Not Hispanic or Latino female here for annual exam.   ?  ? ?No LMP recorded.          ?Sexually active: {yes no:314532}  ?The current method of family planning is {contraception:315051}.    ?Exercising: {yes no:314532}  {types:19826} ?Smoker:  {YES NO:22349} ? ?Health Maintenance: ?Pap:  01-20-17 Normal HPV POS ?History of abnormal Pap:  yes ?MMG:  09-26-20 Bi Rads 4 Left breast calcifications---10-03-20 Biopsy Benign ?BMD:   N/A ?Colonoscopy: N/A ?TDaP:  2022 ?Gardasil: *** ? ? reports that she has never smoked. She has never used smokeless tobacco. She reports current alcohol use. She reports that she does not use drugs. ? ?Past Medical History:  ?Diagnosis Date  ? Diabetes mellitus without complication ([place]) 0/10/43  ? DVT (deep venous thrombosis) ([place])   ? GERD (gastroesophageal reflux disease)   ? Hemorrhoids   ? ? ?Past Surgical History:  ?Procedure Laterality Date  ? TUBAL LIGATION    ? ? ?Current Outpatient Medications  ?Medication Sig Dispense Refill  ? empagliflozin (JARDIANCE) 10 MG TABS tablet Take 1 tablet (10 mg total) by mouth daily before breakfast. 30 tablet 3  ? Cholecalciferol (VITAMIN D) 50 MCG (2000 UT) CAPS Take 2,000 Units by mouth.    ? ibuprofen (ADVIL,MOTRIN) 200 MG tablet Take 200 mg by mouth as needed. (Patient not taking: Reported on 10/15/2020)    ? omeprazole (PRILOSEC) 40 MG capsule Take 1 capsule (40 mg total) by mouth daily. 30 capsule 3  ? ?No current facility-administered medications for this visit.  ? ? ?Family History  ?Problem Relation Age of Onset  ? Diabetes Mother   ? Breast cancer Neg Hx   ? ? ?Review of Systems ? ?Exam:   ?There were no vitals taken for this visit.  Weight change: '@WEIGHTCHANGE'$ @ Height:      ?Ht Readings from Last 3 Encounters:  ?10/15/20 '5\' 6"'$  (1.676 m)  ?02/08/20 '5\' 6"'$  (1.676 m)  ?08/18/19 '5\' 6"'$  (1.676 m)  ? ? ?General appearance: alert, cooperative and appears stated age ?Head: Normocephalic, without obvious  abnormality, atraumatic ?Neck: no adenopathy, supple, symmetrical, trachea midline and thyroid {CHL AMB PHY EX THYROID NORM DEFAULT:[phone]::"normal to inspection and palpation"} ?Lungs: clear to auscultation bilaterally ?Cardiovascular: regular rate and rhythm ?Breasts: {Exam; breast:13139::"normal appearance, no masses or tenderness"} ?Abdomen: soft, non-tender; non distended,  no masses,  no organomegaly ?Extremities: extremities normal, atraumatic, no cyanosis or edema ?Skin: Skin color, texture, turgor normal. No rashes or lesions ?Lymph nodes: Cervical, supraclavicular, and axillary nodes normal. ?No abnormal inguinal nodes palpated ?Neurologic: Grossly normal ? ? ?Pelvic: External genitalia:  no lesions ?             Urethra:  normal appearing urethra with no masses, tenderness or lesions ?             Bartholins and Skenes: normal    ?             Vagina: normal appearing vagina with normal color and discharge, no lesions ?             Cervix: {CHL AMB PHY EX CERVIX NORM DEFAULT:[id number]::"no lesions"} ?              ?Bimanual Exam:  Uterus:  {CHL AMB PHY EX UTERUS NORM DEFAULT:[phone]::"normal size, contour, position, consistency, mobility, non-tender"} ?             Adnexa: {CHL AMB PHY EX ADNEXA NO MASS  DEFAULT:[phone]::"no mass, fullness, tenderness"} ?              Rectovaginal: Confirms ?              Anus:  normal sphincter tone, no lesions ? ?*** chaperoned for the exam. ? ?A:  Well Woman with normal exam ? ?P:    ? ?  ?

## 2021-09-12 ENCOUNTER — Encounter: Payer: Self-pay | Admitting: Obstetrics and Gynecology

## 2021-09-25 NOTE — Progress Notes (Signed)
46 y.o. G2P2 Married Black or [place] American Not Hispanic or Latino female here for annual exam.  Cycles q 1-3 months x 7 days. She can saturate a pad in 3 hours. Sometimes has intermenstrual spotting. Can be random or after intercourse. Spotting started last year. Cycles have been irregular for 2 years. ?She is hot a lot, some hot flashes at night. Up every other night 2-3 x a night. Can be soaked.  ?Sexually active, no pain. ?  ?She had an ultrasound last year, it showed an endometrial polyp. ?H/O DM, last hgbA1C was 7.9, not on medication. Has an appointment with Endocrinology this week.  ? ?No LMP recorded.          ?Sexually active: Yes.    ?The current method of family planning is tubal ligation.    ?Exercising: No.     ?Smoker:  no ? ?Health Maintenance: ?Pap:   last year ?History of abnormal Pap:  no ?MMG:  09/26/2020 ?BMD:   n/a ?Colonoscopy: n/a ?TDaP:  10/15/2020 ?Gardasil: n/a ? ? reports that she has never smoked. She has never used smokeless tobacco. She reports current alcohol use. She reports that she does not use drugs. 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 Kids are 58 and almost 42. 82 year old grandson (sons kid). 47 year old daughter is pregnant with a baby girl.  ? ?Past Medical History:  ?Diagnosis Date  ? Diabetes mellitus without complication ([place]) 08/06/3426  ? DVT (deep venous thrombosis) ([place])   ? GERD (gastroesophageal reflux disease)   ? Hemorrhoids   ? ? ?Past Surgical History:  ?Procedure Laterality Date  ? HEMORROIDECTOMY    ? TUBAL LIGATION    ? ? ?Current Outpatient Medications  ?Medication Sig Dispense Refill  ? ibuprofen (ADVIL,MOTRIN) 200 MG tablet Take 200 mg by mouth as needed.    ? ?No current facility-administered medications for this visit.  ? ? ?Family History  ?Problem Relation Age of Onset  ? Diabetes Mother   ? Breast cancer Neg Hx   ? ? ?Review of Systems  ?All other systems reviewed and are negative. ? ?Exam:   ?BP 126/80 (BP Location:  Right Arm, Patient Position: Sitting, Cuff Size: Large)   Pulse 84   Ht '5\' 5"'$  (1.651 m)   Wt 235 lb (106.6 kg)   BMI 39.11 kg/m?   Weight change: '@WEIGHTCHANGE'$ @ Height:   Height: '5\' 5"'$  (165.1 cm)  ?Ht Readings from Last 3 Encounters:  ?09/29/21 '5\' 5"'$  (1.651 m)  ?10/15/20 '5\' 6"'$  (1.676 m)  ?02/08/20 '5\' 6"'$  (1.676 m)  ? ? ?General appearance: alert, cooperative and appears stated age ?Head: Normocephalic, without obvious abnormality, atraumatic ?Neck: no adenopathy, supple, symmetrical, trachea midline and thyroid normal to inspection and palpation ?Lungs: clear to auscultation bilaterally ?Cardiovascular: regular rate and rhythm ?Breasts: normal appearance, no masses or tenderness ?Abdomen: soft, non-tender; non distended,  no masses,  no organomegaly ?Extremities: extremities normal, atraumatic, no cyanosis or edema ?Skin: Skin color, texture, turgor normal. No rashes or lesions ?Lymph nodes: Cervical, supraclavicular, and axillary nodes normal. ?No abnormal inguinal nodes palpated ?Neurologic: Grossly normal ? ? ?Pelvic: External genitalia:  no lesions ?             Urethra:  normal appearing urethra with no masses, tenderness or lesions ?             Bartholins and Skenes: normal    ?  Vagina: normal appearing vagina with normal color and discharge, no lesions ?             Cervix: no lesions ?              ?Bimanual Exam:  Uterus:  normal size, contour, position, consistency, mobility, non-tender and anteverted ?             Adnexa: no mass, fullness, tenderness ?              Rectovaginal: Confirms ?              Anus:  normal sphincter tone, no lesions ? ?Wandra Scot, RMA chaperoned for the exam. ? ?1. Well woman exam ?Mammogram due ? ?2. Vitamin D deficiency ?- VITAMIN D 25 Hydroxy (Vit-D Deficiency, Fractures) ? ?3. Hyperlipidemia associated with type 2 diabetes mellitus ([place]) ?- Lipid panel ? ?4. Diabetes mellitus without complication ([place]) ?- Hemoglobin A1c ? ?5. Laboratory exam ordered as part  of routine general medical examination ?- CBC ?- Comprehensive metabolic panel ? ?6. Secondary oligomenorrhea ?- TSH ?- Prolactin ?-We discussed perimenopause and irregular cycles. Discussed the option of using cyclic provera or a mirena IUD, she prefers the provera ? ?7. Colon cancer screening ?- Ambulatory referral to Gastroenterology ? ?8. Endometrial polyp ?Diagnosed last year ?She is having irregular bleeding and some intermenstrual spotting ?Plan: hysteroscopy, dilation and curettage. Reviewed risks, including: bleeding, infection, uterine perforation, fluid overload, need for further sugery ?Discussed the mirena IUD or cyclic provera, she prefers cyclic provera ? ? ?

## 2021-09-25 NOTE — H&P (View-Only) (Signed)
46 y.o. G2P2 Married Black or [place] American Not Hispanic or Latino female here for annual exam.  Cycles q 1-3 months x 7 days. She can saturate a pad in 3 hours. Sometimes has intermenstrual spotting. Can be random or after intercourse. Spotting started last year. Cycles have been irregular for 2 years. She is hot a lot, some hot flashes at night. Up every other night 2-3 x a night. Can be soaked.  Sexually active, no pain.   She had an ultrasound last year, it showed an endometrial polyp. H/O DM, last hgbA1C was 7.9, not on medication. Has an appointment with Endocrinology this week.   No LMP recorded.          Sexually active: Yes.    The current method of family planning is tubal ligation.    Exercising: No.     Smoker:  no  Health Maintenance: Pap:   last year History of abnormal Pap:  no MMG:  09/26/2020 BMD:   n/a Colonoscopy: n/a TDaP:  10/15/2020 Gardasil: n/a   reports that she has never smoked. She has never used smokeless tobacco. She reports current alcohol use. She reports that she does not use drugs. 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 38 year old grandson (sons kid). 1 year old daughter is pregnant with a baby girl.   Past Medical History:  Diagnosis Date   Diabetes mellitus without complication ([place]) 11/05/8936   DVT (deep venous thrombosis) ([facility])    GERD (gastroesophageal reflux disease)    Hemorrhoids     Past Surgical History:  Procedure Laterality Date   HEMORROIDECTOMY     TUBAL LIGATION      Current Outpatient Medications  Medication Sig Dispense Refill   ibuprofen (ADVIL,MOTRIN) 200 MG tablet Take 200 mg by mouth as needed.     No current facility-administered medications for this visit.    Family History  Problem Relation Age of Onset   Diabetes Mother    Breast cancer Neg Hx     Review of Systems  All other systems reviewed and are negative.  Exam:   BP 126/80 (BP Location:  Right Arm, Patient Position: Sitting, Cuff Size: Large)   Pulse 84   Ht '5\' 5"'$  (1.651 m)   Wt 235 lb (106.6 kg)   BMI 39.11 kg/m   Weight change: '@WEIGHTCHANGE'$ @ Height:   Height: '5\' 5"'$  (165.1 cm)  Ht Readings from Last 3 Encounters:  09/29/21 '5\' 5"'$  (1.651 m)  10/15/20 '5\' 6"'$  (1.676 m)  02/08/20 '5\' 6"'$  (1.676 m)    General appearance: alert, cooperative and appears stated age Head: Normocephalic, without obvious abnormality, atraumatic Neck: no adenopathy, supple, symmetrical, trachea midline and thyroid normal to inspection and palpation Lungs: clear to auscultation bilaterally Cardiovascular: regular rate and rhythm Breasts: normal appearance, no masses or tenderness Abdomen: soft, non-tender; non distended,  no masses,  no organomegaly Extremities: extremities normal, atraumatic, no cyanosis or edema Skin: Skin color, texture, turgor normal. No rashes or lesions Lymph nodes: Cervical, supraclavicular, and axillary nodes normal. No abnormal inguinal nodes palpated Neurologic: Grossly normal   Pelvic: External genitalia:  no lesions              Urethra:  normal appearing urethra with no masses, tenderness or lesions              Bartholins and Skenes: normal  Vagina: normal appearing vagina with normal color and discharge, no lesions              Cervix: no lesions               Bimanual Exam:  Uterus:  normal size, contour, position, consistency, mobility, non-tender and anteverted              Adnexa: no mass, fullness, tenderness               Rectovaginal: Confirms               Anus:  normal sphincter tone, no lesions  Wandra Scot, RMA chaperoned for the exam.  1. Well woman exam Mammogram due  2. Vitamin D deficiency - VITAMIN D 25 Hydroxy (Vit-D Deficiency, Fractures)  3. Hyperlipidemia associated with type 2 diabetes mellitus ([place]) - Lipid panel  4. Diabetes mellitus without complication ([place]) - Hemoglobin A1c  5. Laboratory exam ordered as part  of routine general medical examination - CBC - Comprehensive metabolic panel  6. Secondary oligomenorrhea - TSH - Prolactin -We discussed perimenopause and irregular cycles. Discussed the option of using cyclic provera or a mirena IUD, she prefers the provera  7. Colon cancer screening - Ambulatory referral to Gastroenterology  8. Endometrial polyp Diagnosed last year She is having irregular bleeding and some intermenstrual spotting Plan: hysteroscopy, dilation and curettage. Reviewed risks, including: bleeding, infection, uterine perforation, fluid overload, need for further sugery Discussed the mirena IUD or cyclic provera, she prefers cyclic provera

## 2021-09-29 ENCOUNTER — Other Ambulatory Visit (HOSPITAL_COMMUNITY)
Admission: RE | Admit: 2021-09-29 | Discharge: 2021-09-29 | Disposition: A | Discharge status: Home / Self Care | Payer: 59 | Source: Ambulatory Visit | Attending: Obstetrics and Gynecology | Admitting: Obstetrics and Gynecology

## 2021-09-29 ENCOUNTER — Encounter: Payer: Self-pay | Admitting: Obstetrics and Gynecology

## 2021-09-29 ENCOUNTER — Telehealth: Payer: Self-pay | Admitting: Obstetrics and Gynecology

## 2021-09-29 ENCOUNTER — Ambulatory Visit (INDEPENDENT_AMBULATORY_CARE_PROVIDER_SITE_OTHER): Payer: 59 | Admitting: Obstetrics and Gynecology

## 2021-09-29 VITALS — BP 126/80 | HR 84 | Ht 65.0 in | Wt 235.0 lb

## 2021-09-29 DIAGNOSIS — E119 Type 2 diabetes mellitus without complications: Secondary | ICD-10-CM | POA: Diagnosis not present

## 2021-09-29 DIAGNOSIS — N914 Secondary oligomenorrhea: Secondary | ICD-10-CM | POA: Diagnosis not present

## 2021-09-29 DIAGNOSIS — N840 Polyp of corpus uteri: Secondary | ICD-10-CM

## 2021-09-29 DIAGNOSIS — Z01419 Encounter for gynecological examination (general) (routine) without abnormal findings: Secondary | ICD-10-CM

## 2021-09-29 DIAGNOSIS — E559 Vitamin D deficiency, unspecified: Secondary | ICD-10-CM

## 2021-09-29 DIAGNOSIS — Z124 Encounter for screening for malignant neoplasm of cervix: Secondary | ICD-10-CM

## 2021-09-29 DIAGNOSIS — E785 Hyperlipidemia, unspecified: Secondary | ICD-10-CM

## 2021-09-29 DIAGNOSIS — Z1211 Encounter for screening for malignant neoplasm of colon: Secondary | ICD-10-CM

## 2021-09-29 DIAGNOSIS — E1169 Type 2 diabetes mellitus with other specified complication: Secondary | ICD-10-CM | POA: Diagnosis not present

## 2021-09-29 DIAGNOSIS — Z0000 Encounter for general adult medical examination without abnormal findings: Secondary | ICD-10-CM

## 2021-09-29 NOTE — Telephone Encounter (Signed)
Surgery: CPT [phone] - Hysteroscopy/D&C/Myosure ? ?Diagnosis: N84.0 Endometrial Polyp, N93.9 Abnormal Uterine Bleeding,  ? ?Location: [facility] ? ?Status: Outpatient ? ?Time: 30 Minutes ? ?Assistant: N/A ? ?Urgency: At Patient's Convenience ? ?Pre-Op Appointment: Completed ? ?Post-Op Appointment(s): 1 Week, ? ?Time Out Of Work: Day Of Surgery, 1 Day Post Op ? ?

## 2021-09-29 NOTE — Patient Instructions (Addendum)
Can try estroven for hot flashes or estroven pm for night sweats.  ? ?EXERCISE   We recommended that you start or continue a regular exercise program for good health. Physical activity is anything that gets your body moving, some is better than none. The CDC recommends 150 minutes per week of Moderate-Intensity Aerobic Activity and 2 or more days of Muscle Strengthening Activity. ? ?Benefits of exercise are limitless: helps weight loss/weight maintenance, improves mood and energy, helps with depression and anxiety, improves sleep, tones and strengthens muscles, improves balance, improves bone density, protects from chronic conditions such as heart disease, high blood pressure and diabetes and so much more. ?To learn more visit: WhyNotPoker.uy ? ?DIET: Good nutrition starts with a healthy diet of fruits, vegetables, whole grains, and lean protein sources. Drink plenty of water for hydration. Minimize empty calories, sodium, sweets. For more information about dietary recommendations visit: GeekRegister.com.ee and http://schaefer-mitchell.com/ ? ?ALCOHOL:  Women should limit their alcohol intake to no more than 7 drinks/beers/glasses of wine (combined, not each!) per week. Moderation of alcohol intake to this level decreases your risk of breast cancer and liver damage.  If you are concerned that you may have a problem, or your friends have told you they are concerned about your drinking, there are many resources to help. A well-known program that is free, effective, and available to all people all over the nation is Alcoholics Anonymous.  Check out this site to learn more: BlockTaxes.se ? ? ?CALCIUM AND VITAMIN D:  Adequate intake of calcium and Vitamin D are recommended for bone health.  You should be getting between 1000-1200 mg of calcium and 800 units of Vitamin D daily between diet and supplements ? ?PAP SMEARS:  Pap smears,  to check for cervical cancer or precancers,  have traditionally been done yearly, scientific advances have shown that most women can have pap smears less often.  However, every woman still should have a physical exam from her gynecologist every year. It will include a breast check, inspection of the vulva and vagina to check for abnormal growths or skin changes, a visual exam of the cervix, and then an exam to evaluate the size and shape of the uterus and ovaries. We will also provide age appropriate advice regarding health maintenance, like when you should have certain vaccines, screening for sexually transmitted diseases, bone density testing, colonoscopy, mammograms, etc.  ? ?MAMMOGRAMS:  All women over 32 years old should have a routine mammogram.  ? ?COLON CANCER SCREENING: Now recommend starting at age 85. At this time colonoscopy is not covered for routine screening until 50. There are take home tests that can be done between 45-49.  ? ?COLONOSCOPY:  Colonoscopy to screen for colon cancer is recommended for all women at age 31.  We know, you hate the idea of the prep.  We agree, BUT, having colon cancer and not knowing it is worse!!  Colon cancer so often starts as a polyp that can be seen and removed at colonscopy, which can quite literally save your life!  And if your first colonoscopy is normal and you have no family history of colon cancer, most women don't have to have it again for 10 years.  Once every ten years, you can do something that may end up saving your life, right?  We will be happy to help you get it scheduled when you are ready.  Be sure to check your insurance coverage so you understand how much it will cost.  It  may be covered as a preventative service at no cost, but you should check your particular policy.   ? ? ? ?Breast Self-Awareness ?Breast self-awareness means being familiar with how your breasts look and feel. It involves checking your breasts regularly and reporting any changes to  your health care provider. ?Practicing breast self-awareness is important. A change in your breasts can be a sign of a serious medical problem. Being familiar with how your breasts look and feel allows you to find any problems early, when treatment is more likely to be successful. All women should practice breast self-awareness, including women who have had breast implants. ?How to do a breast self-exam ?One way to learn what is normal for your breasts and whether your breasts are changing is to do a breast self-exam. To do a breast self-exam: ?Look for Changes ? ?Remove all the clothing above your waist. ?Stand in front of a mirror in a room with good lighting. ?Put your hands on your hips. ?Push your hands firmly downward. ?Compare your breasts in the mirror. Look for differences between them (asymmetry), such as: ?Differences in shape. ?Differences in size. ?Puckers, dips, and bumps in one breast and not the other. ?Look at each breast for changes in your skin, such as: ?Redness. ?Scaly areas. ?Look for changes in your nipples, such as: ?Discharge. ?Bleeding. ?Dimpling. ?Redness. ?A change in position. ?Feel for Changes ?Carefully feel your breasts for lumps and changes. It is best to do this while lying on your back on the floor and again while sitting or standing in the shower or tub with soapy water on your skin. Feel each breast in the following way: ?Place the arm on the side of the breast you are examining above your head. ?Feel your breast with the other hand. ?Start in the nipple area and make ? inch (2 cm) overlapping circles to feel your breast. Use the pads of your three middle fingers to do this. Apply light pressure, then medium pressure, then firm pressure. The light pressure will allow you to feel the tissue closest to the skin. The medium pressure will allow you to feel the tissue that is a little deeper. The firm pressure will allow you to feel the tissue close to the ribs. ?Continue the  overlapping circles, moving downward over the breast until you feel your ribs below your breast. ?Move one finger-width toward the center of the body. Continue to use the ? inch (2 cm) overlapping circles to feel your breast as you move slowly up toward your collarbone. ?Continue the up and down exam using all three pressures until you reach your armpit. ? ?Write Down What You Find ? ?Write down what is normal for each breast and any changes that you find. Keep a written record with breast changes or normal findings for each breast. By writing this information down, you do not need to depend only on memory for size, tenderness, or location. Write down where you are in your menstrual cycle, if you are still menstruating. ?If you are having trouble noticing differences in your breasts, do not get discouraged. With time you will become more familiar with the variations in your breasts and more comfortable with the exam. ?How often should I examine my breasts? ?Examine your breasts every month. If you are breastfeeding, the best time to examine your breasts is after a feeding or after using a breast pump. If you menstruate, the best time to examine your breasts is 5-7 days after your period  is over. During your period, your breasts are lumpier, and it may be more difficult to notice changes. ?When should I see my health care provider? ?See your health care provider if you notice: ?A change in shape or size of your breasts or nipples. ?A change in the skin of your breast or nipples, such as a reddened or scaly area. ?Unusual discharge from your nipples. ?A lump or thick area that was not there before. ?Pain in your breasts. ?Anything that concerns you. ? ?Perimenopause ?Perimenopause is the normal time of a woman's life when the levels of estrogen, the female hormone produced by the ovaries, begin to decrease. This leads to changes in menstrual periods before they stop completely (menopause). Perimenopause can begin 2-8  years before menopause. During perimenopause, the ovaries may or may not produce an egg and a woman can still become pregnant. ?What are the causes? ?This condition is caused by a natural change in hormone lev

## 2021-09-30 LAB — CBC
HCT: 40.2 % (ref 35.0–45.0)
Hemoglobin: 12.9 g/dL (ref 11.7–15.5)
MCH: 25 pg — ABNORMAL LOW (ref 27.0–33.0)
MCHC: 32.1 g/dL (ref 32.0–36.0)
MCV: 78.1 fL — ABNORMAL LOW (ref 80.0–100.0)
MPV: 9.7 fL (ref 7.5–12.5)
Platelets: 428 10*3/uL — ABNORMAL HIGH (ref 140–400)
RBC: 5.15 10*6/uL — ABNORMAL HIGH (ref 3.80–5.10)
RDW: 14.9 % (ref 11.0–15.0)
WBC: 9.5 10*3/uL (ref 3.8–10.8)

## 2021-09-30 LAB — LIPID PANEL
Cholesterol: 148 mg/dL (ref <200)
HDL: 44 mg/dL — ABNORMAL LOW (ref >=50)
LDL Cholesterol (Calc): 84 mg/dL (calc)
Non-HDL Cholesterol (Calc): 104 mg/dL (calc) (ref <130)
Total CHOL/HDL Ratio: 3.4 (calc) (ref <5.0)
Triglycerides: 108 mg/dL (ref <150)

## 2021-09-30 LAB — COMPREHENSIVE METABOLIC PANEL
AG Ratio: 1.2 (calc) (ref 1.0–2.5)
ALT: 19 U/L (ref 6–29)
AST: 23 U/L (ref 10–35)
Albumin: 4.4 g/dL (ref 3.6–5.1)
Alkaline phosphatase (APISO): 91 U/L (ref 31–125)
BUN: 9 mg/dL (ref 7–25)
CO2: 23 mmol/L (ref 20–32)
Calcium: 9.5 mg/dL (ref 8.6–10.2)
Chloride: 103 mmol/L (ref 98–110)
Creat: 0.77 mg/dL (ref 0.50–0.99)
Globulin: 3.8 g/dL (calc) — ABNORMAL HIGH (ref 1.9–3.7)
Glucose, Bld: 181 mg/dL — ABNORMAL HIGH (ref 65–99)
Potassium: 4.1 mmol/L (ref 3.5–5.3)
Sodium: 136 mmol/L (ref 135–146)
Total Bilirubin: 0.5 mg/dL (ref 0.2–1.2)
Total Protein: 8.2 g/dL — ABNORMAL HIGH (ref 6.1–8.1)

## 2021-09-30 LAB — HEMOGLOBIN A1C
Hgb A1c MFr Bld: 8.5 % of total Hgb — ABNORMAL HIGH (ref <5.7)
Mean Plasma Glucose: 197 mg/dL
eAG (mmol/L): 10.9 mmol/L

## 2021-09-30 LAB — CYTOLOGY - PAP
Comment: NEGATIVE
Diagnosis: NEGATIVE
High risk HPV: POSITIVE — AB

## 2021-09-30 LAB — VITAMIN D 25 HYDROXY (VIT D DEFICIENCY, FRACTURES): Vit D, 25-Hydroxy: 19 ng/mL — ABNORMAL LOW (ref 30–100)

## 2021-09-30 LAB — PROLACTIN: Prolactin: 2.9 ng/mL

## 2021-09-30 LAB — TSH: TSH: 2.04 mIU/L

## 2021-09-30 NOTE — Telephone Encounter (Signed)
Spoke with patient regarding surgery benefits. Patient acknowledges understanding of information presented. Patient is aware that benefits presented are professional benefits only. Patient is aware the hospital will call with facility benefits. See account note.  Routing to Jill Hamm, RN.  

## 2021-09-30 NOTE — Telephone Encounter (Signed)
Spoke with patient. Reviewed surgery dates. Patient request to proceed with surgery on 10/20/21.  I will return call once surgery date and time confirmed. Patient verbalizes understanding and is agreeable.  ? ?Surgery request sent.  ? ?

## 2021-10-01 ENCOUNTER — Other Ambulatory Visit: Payer: Self-pay

## 2021-10-01 ENCOUNTER — Telehealth: Payer: Self-pay | Admitting: Obstetrics and Gynecology

## 2021-10-01 DIAGNOSIS — R87810 Cervical high risk human papillomavirus (HPV) DNA test positive: Secondary | ICD-10-CM

## 2021-10-01 NOTE — Telephone Encounter (Signed)
Spoke with patient. Surgery date request confirmed.  ?Advised surgery is scheduled for 10/20/21, [place], [place].  ?Surgery instruction sheet and hospital brochure reviewed, printed copy will be mailed.  ?Patient verbalizes understanding and is agreeable.  ?Routing to provider. Encounter closed.  ? ?Cc: Hayley C ? ?

## 2021-10-01 NOTE — Telephone Encounter (Signed)
Spoke with patient and informed her of results and need for colposcopy.  Procedure briefly explained to patient.  Advised possible biopsy and if she can take Ibuprofen or Alleve that she could take a dose 30 minutes prior to appt to help any cramping she might have if Dr. Talbert Nan performs biopsy. I did tell her we have that type medication  here we can give her if she opts to wait and see how she feels prior to taking.  I encouraged her to eat lunch that day.  Appt desk will placed her on scheduled for 10/08/21 at 2:30pm as patient was able to come at this appt. ?

## 2021-10-01 NOTE — Telephone Encounter (Signed)
Please let the patient that I have gotten a copy of her pap from last year. It was negative with endometrial cells and +HPV. ?Given her pap this year with +HPV, she needs a colposcopy.  ?Please get her in with me next week for a colposcopy. I want the results prior to her surgery on 10/20/21, if she needs further treatment it can be done in the OR.  ?Please put her in next Wednesday, 5/10 at 2:30.  ?

## 2021-10-08 ENCOUNTER — Ambulatory Visit: Payer: 59 | Admitting: Obstetrics and Gynecology

## 2021-10-16 ENCOUNTER — Other Ambulatory Visit: Payer: Self-pay

## 2021-10-16 ENCOUNTER — Other Ambulatory Visit (HOSPITAL_COMMUNITY)
Admission: RE | Admit: 2021-10-16 | Discharge: 2021-10-16 | Disposition: A | Discharge status: Home / Self Care | Payer: 59 | Source: Ambulatory Visit | Attending: Obstetrics and Gynecology | Admitting: Obstetrics and Gynecology

## 2021-10-16 ENCOUNTER — Encounter (HOSPITAL_BASED_OUTPATIENT_CLINIC_OR_DEPARTMENT_OTHER): Payer: Self-pay | Admitting: Obstetrics and Gynecology

## 2021-10-16 ENCOUNTER — Encounter: Payer: Self-pay | Admitting: Obstetrics and Gynecology

## 2021-10-16 ENCOUNTER — Ambulatory Visit (INDEPENDENT_AMBULATORY_CARE_PROVIDER_SITE_OTHER): Payer: 59 | Admitting: Obstetrics and Gynecology

## 2021-10-16 VITALS — BP 118/74

## 2021-10-16 DIAGNOSIS — R87810 Cervical high risk human papillomavirus (HPV) DNA test positive: Secondary | ICD-10-CM | POA: Diagnosis present

## 2021-10-16 DIAGNOSIS — N882 Stricture and stenosis of cervix uteri: Secondary | ICD-10-CM

## 2021-10-16 MED ORDER — MISOPROSTOL 200 MCG PO TABS: ORAL_TABLET | ORAL | 0 refills | Status: DC

## 2021-10-16 NOTE — Patient Instructions (Signed)

## 2021-10-16 NOTE — Progress Notes (Signed)
GYNECOLOGY  VISIT   HPI: 46 y.o.   Married Black or [place] American Not Hispanic or Latino  female   [phone] with Patient's last menstrual period was 08/28/2021.   here for  Colposcopy  Pap from 09/29/21 returned as normal, +HPV Pap from 11/14/20 negative, + endometrial cells, +HPV Pap from 01/20/17 negative, +HPV   GYNECOLOGIC HISTORY: Patient's last menstrual period was 08/28/2021. Contraception:BTL Menopausal hormone therapy: none        OB History     Gravida  2   Para  2   Term      Preterm      AB  0   Living  2      SAB  0   IAB      Ectopic  0   Multiple      Live Births                 Patient Active Problem List   Diagnosis Date Noted   Hyperlipidemia associated with type 2 diabetes mellitus ([facility]) 10/15/2020   Vitamin D deficiency 02/08/2020   Anxiety 03/29/2019   Diabetes mellitus without complication ([facility]) 62/83/1517   Morbid obesity ([facility]) 12/18/2013   Screening for malignant neoplasm of cervix 10/27/2012   Physical exam 10/27/2012   INTERNAL HEMORRHOIDS 07/03/2009   CARPAL TUNNEL SYNDROME, RIGHT 06/07/2008    Past Medical History:  Diagnosis Date   Diabetes mellitus without complication ([facility]) 10/31/6071   DVT (deep venous thrombosis) ([facility])    GERD (gastroesophageal reflux disease)    Hemorrhoids     Past Surgical History:  Procedure Laterality Date   HEMORROIDECTOMY     TUBAL LIGATION      Current Outpatient Medications  Medication Sig Dispense Refill   ibuprofen (ADVIL,MOTRIN) 200 MG tablet Take 200 mg by mouth as needed.     metFORMIN (GLUCOPHAGE-XR) 500 MG 24 hr tablet Take 500 mg by mouth 2 (two) times daily. (Patient not taking: Reported on 10/16/2021)     No current facility-administered medications for this visit.     ALLERGIES: Patient has no known allergies.  Family History  Problem Relation Age of Onset   Diabetes Mother    Breast cancer Neg Hx     Social History   Socioeconomic History   Marital status:  Married    Spouse name: Not on file   Number of children: Not on file   Years of education: Not on file   Highest education level: Not on file  Occupational History   Not on file  Tobacco Use   Smoking status: Never   Smokeless tobacco: Never  Vaping Use   Vaping Use: Never used  Substance and Sexual Activity   Alcohol use: Yes    Comment: 2 glasses of wine per weekend   Drug use: No   Sexual activity: Yes    Partners: Male    Birth control/protection: Surgical    Comment: BTL  Other Topics Concern   Not on file  Social History Narrative   Right handed   One story apartment   No caffeine, occ soda   Social Determinants of Health   Financial Resource Strain: Not on file  Food Insecurity: Not on file  Transportation Needs: Not on file  Physical Activity: Not on file  Stress: Not on file  Social Connections: Not on file  Intimate Partner Violence: Not on file    ROS  PHYSICAL EXAMINATION:    BP 118/74 (Cuff Size: Large)   LMP  08/28/2021     General appearance: alert, cooperative and appears stated age  Pelvic: External genitalia:  no lesions              Urethra:  normal appearing urethra with no masses, tenderness or lesions              Bartholins and Skenes: normal                 Vagina: normal appearing vagina with normal color and discharge, no lesions              Cervix: no lesions and stenotic  Colposcopy: unsatisfactory, no aceto-white changes. Decreased lugols uptake on the cervix at 4 o'clock, biopsy done. The cervix was stenotic, needed to place a tenaculum and dilate to a #4 hagar dilator in order to do the ECC. ECC done. Negative lugols examination of the upper vagina. Hemostasis achieved with silver nitrate.                Chaperone was present for exam.  1. Papanicolaou smear of cervix with positive high risk human papilloma virus (HPV) test - Colposcopy - Surgical pathology( [facility]/ [facility])  2. Cervical stenosis (uterine cervix) -  misoprostol (CYTOTEC) 200 MCG tablet; Place 2 tablets vaginally the night prior to surgery  Dispense: 2 tablet; Refill: 0

## 2021-10-16 NOTE — Progress Notes (Addendum)
Spoke w/ via phone for pre-op interview--- pt Lab needs dos---- istat, ekg, urine preg / pre-op orders pending              Lab results------ no COVID test -----patient states asymptomatic no test needed Arrive at ------- 0830 on 10-20-2021 NPO after MN NO Solid Food.  Clear liquids from MN until--- 0730 Med rec completed Medications to take morning of surgery ----- none Diabetic medication ----- n/a  (pt does not take) Patient instructed no nail polish to be worn day of surgery Patient instructed to bring photo id and insurance card day of surgery Patient aware to have Driver (ride ) / caregiver for 24 hours after surgery -- husband, Cassie Rich Patient Special Instructions ----- n/a Pre-Op special Istructions ----- sent IB message to dr Talbert Nan in epic, requested orders Patient verbalized understanding of instructions that were given at this phone interview. Patient denies shortness of breath, chest pain, fever, cough at this phone interview.

## 2021-10-20 ENCOUNTER — Encounter (HOSPITAL_BASED_OUTPATIENT_CLINIC_OR_DEPARTMENT_OTHER): Payer: Self-pay | Admitting: Obstetrics and Gynecology

## 2021-10-20 ENCOUNTER — Encounter (HOSPITAL_BASED_OUTPATIENT_CLINIC_OR_DEPARTMENT_OTHER)
Admission: RE | Disposition: A | Discharge status: Home / Self Care | Payer: Self-pay | Source: Home / Self Care | Attending: Obstetrics and Gynecology

## 2021-10-20 ENCOUNTER — Ambulatory Visit (HOSPITAL_BASED_OUTPATIENT_CLINIC_OR_DEPARTMENT_OTHER): Payer: 59 | Admitting: Anesthesiology

## 2021-10-20 ENCOUNTER — Ambulatory Visit (HOSPITAL_BASED_OUTPATIENT_CLINIC_OR_DEPARTMENT_OTHER)
Admission: RE | Admit: 2021-10-20 | Discharge: 2021-10-20 | Disposition: A | Discharge status: Home / Self Care | Payer: 59 | Attending: Obstetrics and Gynecology | Admitting: Obstetrics and Gynecology

## 2021-10-20 ENCOUNTER — Other Ambulatory Visit: Payer: Self-pay

## 2021-10-20 DIAGNOSIS — E785 Hyperlipidemia, unspecified: Secondary | ICD-10-CM | POA: Insufficient documentation

## 2021-10-20 DIAGNOSIS — C541 Malignant neoplasm of endometrium: Secondary | ICD-10-CM | POA: Insufficient documentation

## 2021-10-20 DIAGNOSIS — E1169 Type 2 diabetes mellitus with other specified complication: Secondary | ICD-10-CM | POA: Diagnosis not present

## 2021-10-20 DIAGNOSIS — N858 Other specified noninflammatory disorders of uterus: Secondary | ICD-10-CM | POA: Diagnosis not present

## 2021-10-20 DIAGNOSIS — Z01818 Encounter for other preprocedural examination: Secondary | ICD-10-CM

## 2021-10-20 DIAGNOSIS — N840 Polyp of corpus uteri: Secondary | ICD-10-CM | POA: Insufficient documentation

## 2021-10-20 DIAGNOSIS — E559 Vitamin D deficiency, unspecified: Secondary | ICD-10-CM | POA: Diagnosis not present

## 2021-10-20 HISTORY — DX: Abnormal uterine and vaginal bleeding, unspecified: N93.9

## 2021-10-20 HISTORY — PX: DILATATION & CURETTAGE/HYSTEROSCOPY WITH MYOSURE: SHX6511

## 2021-10-20 HISTORY — DX: Type 2 diabetes mellitus without complications: E11.9

## 2021-10-20 HISTORY — DX: Presence of spectacles and contact lenses: Z97.3

## 2021-10-20 HISTORY — DX: Polyp of corpus uteri: N84.0

## 2021-10-20 HISTORY — DX: Hyperlipidemia, unspecified: E78.5

## 2021-10-20 LAB — POCT I-STAT, CHEM 8
BUN: 13 mg/dL (ref 6–20)
Calcium, Ion: 1.23 mmol/L (ref 1.15–1.40)
Chloride: 102 mmol/L (ref 98–111)
Creatinine, Ser: 0.7 mg/dL (ref 0.44–1.00)
Glucose, Bld: 174 mg/dL — ABNORMAL HIGH (ref 70–99)
HCT: 43 % (ref 36.0–46.0)
Hemoglobin: 14.6 g/dL (ref 12.0–15.0)
Potassium: 4.1 mmol/L (ref 3.5–5.1)
Sodium: 138 mmol/L (ref 135–145)
TCO2: 25 mmol/L (ref 22–32)

## 2021-10-20 LAB — POCT PREGNANCY, URINE: Preg Test, Ur: NEGATIVE

## 2021-10-20 LAB — GLUCOSE, CAPILLARY: Glucose-Capillary: 187 mg/dL — ABNORMAL HIGH (ref 70–99)

## 2021-10-20 SURGERY — DILATATION & CURETTAGE/HYSTEROSCOPY WITH MYOSURE
Anesthesia: General

## 2021-10-20 MED ORDER — PROPOFOL 10 MG/ML IV BOLUS
INTRAVENOUS | Status: DC | PRN
Start: 2021-10-20 — End: 2021-10-20
  Administered 2021-10-20: 200 mg via INTRAVENOUS

## 2021-10-20 MED ORDER — OXYCODONE HCL 5 MG PO TABS
5.0000 mg | ORAL_TABLET | Freq: Once | ORAL | Status: AC
  Administered 2021-10-20: 5 mg via ORAL

## 2021-10-20 MED ORDER — ACETAMINOPHEN 500 MG PO TABS
1000.0000 mg | ORAL_TABLET | Freq: Once | ORAL | Status: AC
  Administered 2021-10-20: 1000 mg via ORAL

## 2021-10-20 MED ORDER — SODIUM CHLORIDE 0.9 % IR SOLN
Status: DC | PRN
Start: 2021-10-20 — End: 2021-10-20
  Administered 2021-10-20: 1

## 2021-10-20 MED ORDER — LIDOCAINE 2% (20 MG/ML) 5 ML SYRINGE
INTRAMUSCULAR | Status: DC | PRN
  Administered 2021-10-20: 100 mg via INTRAVENOUS

## 2021-10-20 MED ORDER — DEXAMETHASONE SODIUM PHOSPHATE 10 MG/ML IJ SOLN
INTRAMUSCULAR | Status: DC | PRN
  Administered 2021-10-20: 5 mg via INTRAVENOUS

## 2021-10-20 MED ORDER — KETOROLAC TROMETHAMINE 30 MG/ML IJ SOLN
INTRAMUSCULAR | Status: AC
  Filled 2021-10-20: qty 2

## 2021-10-20 MED ORDER — VASOPRESSIN 20 UNIT/ML IV SOLN
INTRAVENOUS | Status: DC | PRN
  Administered 2021-10-20: 20 mL via INTRAMUSCULAR

## 2021-10-20 MED ORDER — KETOROLAC TROMETHAMINE 30 MG/ML IJ SOLN
INTRAMUSCULAR | Status: DC | PRN
  Administered 2021-10-20: 30 mg via INTRAVENOUS

## 2021-10-20 MED ORDER — MIDAZOLAM HCL 2 MG/2ML IJ SOLN
INTRAMUSCULAR | Status: AC
Start: 2021-10-20 — End: ?
  Filled 2021-10-20: qty 2

## 2021-10-20 MED ORDER — OXYCODONE HCL 5 MG PO TABS
ORAL_TABLET | ORAL | Status: AC
  Filled 2021-10-20: qty 1

## 2021-10-20 MED ORDER — FENTANYL CITRATE (PF) 100 MCG/2ML IJ SOLN: 25.0000 ug | INTRAMUSCULAR | Status: DC | PRN

## 2021-10-20 MED ORDER — LIDOCAINE HCL (PF) 2 % IJ SOLN
INTRAMUSCULAR | Status: AC
  Filled 2021-10-20: qty 5

## 2021-10-20 MED ORDER — PROPOFOL 10 MG/ML IV BOLUS
INTRAVENOUS | Status: AC
Start: 2021-10-20 — End: ?
  Filled 2021-10-20: qty 20

## 2021-10-20 MED ORDER — DEXAMETHASONE SODIUM PHOSPHATE 10 MG/ML IJ SOLN
INTRAMUSCULAR | Status: AC
  Filled 2021-10-20: qty 1

## 2021-10-20 MED ORDER — ACETAMINOPHEN 500 MG PO TABS
ORAL_TABLET | ORAL | Status: AC
  Filled 2021-10-20: qty 2

## 2021-10-20 MED ORDER — LACTATED RINGERS IV SOLN: INTRAVENOUS | Status: DC

## 2021-10-20 MED ORDER — ONDANSETRON HCL 4 MG/2ML IJ SOLN
INTRAMUSCULAR | Status: DC | PRN
  Administered 2021-10-20: 4 mg via INTRAVENOUS

## 2021-10-20 MED ORDER — LIDOCAINE HCL (PF) 2 % IJ SOLN
INTRAMUSCULAR | Status: AC
  Filled 2021-10-20: qty 20

## 2021-10-20 MED ORDER — FENTANYL CITRATE (PF) 100 MCG/2ML IJ SOLN
INTRAMUSCULAR | Status: DC | PRN
Start: 2021-10-20 — End: 2021-10-20
  Administered 2021-10-20: 25 ug via INTRAVENOUS
  Administered 2021-10-20 (×2): 50 ug via INTRAVENOUS

## 2021-10-20 MED ORDER — FENTANYL CITRATE (PF) 100 MCG/2ML IJ SOLN
INTRAMUSCULAR | Status: AC
Start: 2021-10-20 — End: ?
  Filled 2021-10-20: qty 2

## 2021-10-20 MED ORDER — MIDAZOLAM HCL 5 MG/5ML IJ SOLN
INTRAMUSCULAR | Status: DC | PRN
  Administered 2021-10-20: 2 mg via INTRAVENOUS

## 2021-10-20 MED ORDER — ONDANSETRON HCL 4 MG/2ML IJ SOLN
INTRAMUSCULAR | Status: AC
  Filled 2021-10-20: qty 2

## 2021-10-20 SURGICAL SUPPLY — 13 items
DEVICE MYOSURE REACH (MISCELLANEOUS) ×1 IMPLANT
DRSG TELFA 3X8 NADH (GAUZE/BANDAGES/DRESSINGS) ×2 IMPLANT
GLOVE BIO SURGEON STRL SZ 6.5 (GLOVE) ×2 IMPLANT
GOWN STRL REUS W/TWL LRG LVL3 (GOWN DISPOSABLE) ×2 IMPLANT
IV NS IRRIG 3000ML ARTHROMATIC (IV SOLUTION) ×2 IMPLANT
KIT PROCEDURE FLUENT (KITS) ×2 IMPLANT
KIT TURNOVER CYSTO (KITS) ×2 IMPLANT
PACK VAGINAL MINOR WOMEN LF (CUSTOM PROCEDURE TRAY) ×2 IMPLANT
PAD DRESSING TELFA 3X8 NADH (GAUZE/BANDAGES/DRESSINGS) IMPLANT
PAD OB MATERNITY 4.3X12.25 (PERSONAL CARE ITEMS) ×2 IMPLANT
PAD PREP 24X48 CUFFED NSTRL (MISCELLANEOUS) ×2 IMPLANT
SEAL ROD LENS SCOPE MYOSURE (ABLATOR) ×2 IMPLANT
TOWEL OR 17X26 10 PK STRL BLUE (TOWEL DISPOSABLE) ×2 IMPLANT

## 2021-10-20 NOTE — Anesthesia Postprocedure Evaluation (Signed)
Anesthesia Post Note  Patient: Cassie Rich  Procedure(s) Performed: DILATATION & CURETTAGE/HYSTEROSCOPY WITH [place]     Patient location during evaluation: PACU Anesthesia Type: General Level of consciousness: awake and alert Pain management: pain level controlled Vital Signs Assessment: post-procedure vital signs reviewed and stable Respiratory status: spontaneous breathing, nonlabored ventilation, respiratory function stable and patient connected to nasal cannula oxygen Cardiovascular status: blood pressure returned to baseline and stable Postop Assessment: no apparent nausea or vomiting Anesthetic complications: no   No notable events documented.  Last Vitals:  Vitals:   10/20/21 1130 10/20/21 1200  BP: (!) 153/66 138/88  Pulse: 78 74  Resp: 14 12  Temp:  36.8 C  SpO2: 95% 96%    Last Pain:  Vitals:   10/20/21 1200  TempSrc:   PainSc: 4                  Travor Royce L Evangaline Jou

## 2021-10-20 NOTE — Anesthesia Procedure Notes (Signed)
Procedure Name: LMA Insertion Date/Time: 10/20/2021 10:05 AM Performed by: Rogers Blocker, CRNA Pre-anesthesia Checklist: Patient identified, Emergency Drugs available, Suction available and Patient being monitored Patient Re-evaluated:Patient Re-evaluated prior to induction Oxygen Delivery Method: Circle System Utilized Preoxygenation: Pre-oxygenation with 100% oxygen Induction Type: IV induction Ventilation: Mask ventilation without difficulty LMA: LMA inserted LMA Size: 4.0 Number of attempts: 1 Placement Confirmation: positive ETCO2 Tube secured with: Tape Dental Injury: Teeth and Oropharynx as per pre-operative assessment

## 2021-10-20 NOTE — Transfer of Care (Signed)
Immediate Anesthesia Transfer of Care Note  Patient: Cassie Rich  Procedure(s) Performed: DILATATION & CURETTAGE/HYSTEROSCOPY WITH MYOSURE  Patient Location: PACU  Anesthesia Type:General  Level of Consciousness: awake, alert , oriented and patient cooperative  Airway & Oxygen Therapy: Patient Spontanous Breathing  Post-op Assessment: Report given to RN and Post -op Vital signs reviewed and stable  Post vital signs: Reviewed and stable  Last Vitals:  Vitals Value Taken Time  BP 160/100 10/20/21 1045  Temp    Pulse 79 10/20/21 1050  Resp 20 10/20/21 1050  SpO2 93 % 10/20/21 1050  Vitals shown include unvalidated device data.  Last Pain:  Vitals:   10/20/21 0853  TempSrc: Oral  PainSc: 3       Patients Stated Pain Goal: 5 (80/06/34 9494)  Complications: No notable events documented.

## 2021-10-20 NOTE — Interval H&P Note (Signed)
History and Physical Interval Note:  10/20/2021 9:55 AM  Cassie Rich  has presented today for surgery, with the diagnosis of endometrial polyp, abnormal uterine bleeding.  The various methods of treatment have been discussed with the patient and family. After consideration of risks, benefits and other options for treatment, the patient has consented to  Procedure(s): [place] (N/A) as a surgical intervention.  The patient's history has been reviewed, patient examined, no change in status, stable for surgery.  I have reviewed the patient's chart and labs.  Questions were answered to the patient's satisfaction.     Salvadore Dom

## 2021-10-20 NOTE — Anesthesia Preprocedure Evaluation (Addendum)
Anesthesia Evaluation  Patient identified by MRN, date of birth, ID band Patient awake    Reviewed: Allergy & Precautions, NPO status , Patient's Chart, lab work & pertinent test results  Airway Mallampati: III  TM Distance: >3 FB Neck ROM: Full    Dental no notable dental hx. (+) Teeth Intact, Dental Advisory Given   Pulmonary neg pulmonary ROS,    Pulmonary exam normal breath sounds clear to auscultation       Cardiovascular + DVT  Normal cardiovascular exam Rhythm:Regular Rate:Normal  TTE 2021 1. Left ventricular ejection fraction, by estimation, is 55 to 60%. The  left ventricle has normal function. The left ventricle has no regional  wall motion abnormalities. Left ventricular diastolic parameters were  normal.  2. Right ventricular systolic function is normal. The right ventricular  size is normal.  3. The mitral valve is normal in structure and function. No evidence of  mitral valve regurgitation. No evidence of mitral stenosis.  4. The aortic valve is tricuspid. Aortic valve regurgitation is not  visualized. No aortic stenosis is present.  5. The inferior vena cava is normal in size with greater than 50%  respiratory variability, suggesting right atrial pressure of 3 mmHg   Neuro/Psych PSYCHIATRIC DISORDERS Anxiety negative neurological ROS     GI/Hepatic negative GI ROS, Neg liver ROS,   Endo/Other  diabetes, Type 2, Oral Hypoglycemic AgentsObese BMI 38  Renal/GU negative Renal ROS  negative genitourinary   Musculoskeletal negative musculoskeletal ROS (+)   Abdominal   Peds  Hematology negative hematology ROS (+)   Anesthesia Other Findings   Reproductive/Obstetrics                            Anesthesia Physical Anesthesia Plan  ASA: 2  Anesthesia Plan: General   Post-op Pain Management: Tylenol PO (pre-op)*   Induction: Intravenous  PONV Risk Score and Plan: 3  and Ondansetron, Dexamethasone and Midazolam  Airway Management Planned: LMA  Additional Equipment:   Intra-op Plan:   Post-operative Plan: Extubation in OR  Informed Consent: I have reviewed the patients History and Physical, chart, labs and discussed the procedure including the risks, benefits and alternatives for the proposed anesthesia with the patient or authorized representative who has indicated his/her understanding and acceptance.     Dental advisory given  Plan Discussed with: CRNA  Anesthesia Plan Comments:         Anesthesia Quick Evaluation

## 2021-10-20 NOTE — Op Note (Signed)
Preoperative Diagnosis: abnormal uterine bleeding, endometrial polyp  Postoperative Diagnosis: abnormal uterine bleeding, endometrial mass  Procedure: Hysteroscopy, resection of endometrial mass, dilation and curettage  Surgeon: Dr Sumner Boast  Assistants: None  Anesthesia: General via LMA  EBL: 15 cc  Fluids: 700 cc  Fluid deficit: 285 cc  Urine output: not recorded.   Indications for surgery: The patient is a 46 yo female, who presented with abnormal uterine bleeding. She had an ultrasound earlier this year that was concerning for an endometrial polyp.  The risks of the surgery were reviewed with the patient and the consent form was signed prior to her surgery.  Findings: Thickened endometrium, sessile mass on the posterior uterine wall, endocervical polyp  Specimens: endometrial mass, endometrial curettings   Procedure: The patient was taken to the operating room with an IV in place. She was placed in the dorsal lithotomy position and anesthesia was administered. She was prepped and draped in the usual sterile fashion for a vaginal procedure. She voided on the way to the OR. A weighted speculum was placed in the vagina and a single tooth tenaculum was placed on the anterior lip of the cervix. The cervix was dilated to a #6 hagar dilator. The uterus was sounded to 10 cm. The myosure hysteroscope was inserted into the uterine cavity. With continuous infusion of normal saline, the uterine cavity was visualized with the above findings. The myosure was then removed and the cavity was curetted with the small sharp curette, a large amount of tissue was removed.  The cavity had the characteristically gritty texture at the end of the procedure. The myosure was reinserted and the posterior endometrial mass was resected, it was unclear if this was a sessile polyp or a myoma, but the tissue wasn't firm. The endocervical polyp was also resected. The Myosure was removed, after a few minutes it was  reinserted. The cavity was empty and the myosure was removed. The single tooth tenaculum was removed. There was more than expected bleeding from the uterine cavity, so the decision was made to inject with intracervical vasopressin. 20 IU of vasopressin was mixed with 100 cc of normal saline. 5 cc of this mixture was injected into each quadrant of the cervix, taking care to make sure the injection wasn't intervascular.  Hemostasis was then excellent. The speculum was removed. The patients perineum was cleansed of betadine and she was taken out of the dorsal lithotomy position.  Upon awakening the LMA was removed and the patient was transferred to the recovery room in stable and awake condition.  The sponge and instrument count were correct. There were no complications.

## 2021-10-20 NOTE — Discharge Instructions (Signed)
  Post Anesthesia Home Care Instructions  Activity: Get plenty of rest for the remainder of the day. A responsible individual must stay with you for 24 hours following the procedure.  For the next 24 hours, DO NOT: -Drive a car -Paediatric nurse -Drink alcoholic beverages -Take any medication unless instructed by your physician -Make any legal decisions or sign important papers.  Meals: Start with liquid foods such as gelatin or soup. Progress to regular foods as tolerated. Avoid greasy, spicy, heavy foods. If nausea and/or vomiting occur, drink only clear liquids until the nausea and/or vomiting subsides. Call your physician if vomiting continues.  Special Instructions/Symptoms: Your throat may feel dry or sore from the anesthesia or the breathing tube placed in your throat during surgery. If this causes discomfort, gargle with warm salt water. The discomfort should disappear within 24 hours.  DISCHARGE INSTRUCTIONS: HYSTEROSCOPY  The following instructions have been prepared to help you care for yourself upon your return home.  May take Ibuprofen after 4:30 PM.   May take stool softner while taking narcotic pain medication to prevent constipation.  Drink plenty of water.  Personal hygiene:  Use sanitary pads for vaginal drainage, not tampons.  Shower the day after your procedure.  NO tub baths, pools or Jacuzzis for 2-3 weeks.  Wipe front to back after using the bathroom.  Activity and limitations:  Do NOT drive or operate any equipment for 24 hours. The effects of anesthesia are still present and drowsiness may result.  Do NOT rest in bed all day.  Walking is encouraged.  Walk up and down stairs slowly.  You may resume your normal activity in one to two days or as indicated by your physician. Sexual activity: NO intercourse for at least 2 weeks after the procedure, or as indicated by your Doctor.  Diet: Eat a light meal as desired this evening. You may resume your usual  diet tomorrow.  Return to Work: You may resume your work activities in one to two days or as indicated by Marine scientist.  What to expect after your surgery: Expect to have vaginal bleeding/discharge for 2-3 days and spotting for up to 10 days. It is not unusual to have soreness for up to 1-2 weeks. You may have a slight burning sensation when you urinate for the first day. Mild cramps may continue for a couple of days. You may have a regular period in 2-6 weeks.  Call your doctor for any of the following:  Excessive vaginal bleeding or clotting, saturating and changing one pad every hour.  Inability to urinate 6 hours after discharge from hospital.  Pain not relieved by pain medication.  Fever of 100.4 F or greater.  Unusual vaginal discharge or odor.  May take Tylenol beginning at 3 PM as needed for pain/cramping.

## 2021-10-21 ENCOUNTER — Encounter (HOSPITAL_BASED_OUTPATIENT_CLINIC_OR_DEPARTMENT_OTHER): Payer: Self-pay | Admitting: Obstetrics and Gynecology

## 2021-10-21 ENCOUNTER — Telehealth: Payer: Self-pay | Admitting: *Deleted

## 2021-10-21 DIAGNOSIS — C541 Malignant neoplasm of endometrium: Secondary | ICD-10-CM

## 2021-10-21 LAB — SURGICAL PATHOLOGY

## 2021-10-21 NOTE — Telephone Encounter (Signed)
-----   Message from Cassie Dom, MD sent at 10/21/2021 12:40 PM EDT ----- I called and spoke with the patient about her results.  Please place referral to [facility] for endometrial cancer

## 2021-10-21 NOTE — Telephone Encounter (Signed)
Referral placed, I called Dr.Tucker office as well and had to leave a message on voicemail. I left detailed message in voicemail that referral has been placed and if patient could be contacted soon and to call me if any questions.

## 2021-10-23 ENCOUNTER — Telehealth: Payer: Self-pay | Admitting: *Deleted

## 2021-10-23 NOTE — Telephone Encounter (Signed)
I called Sharyn Lull at [place] office, Sharyn Lull is aware referral and states she was on her list to call today. Sharyn Lull called patient and she is scheduled at next available appointment which is 11/06/21 with Dr.Tucker.

## 2021-10-23 NOTE — Telephone Encounter (Signed)
Patient left voicemail requesting return call.   Call returned to patient. Patient is requesting update on referral to [facility]/ Dr. Berline Lopes.   Per review of Epic, appt not scheduled. Advised I will forward to [facility] for f/u. Patient thankful for call.   Anderson Malta -can you f/u on referral to [facility]? And update patient.  Thank you.

## 2021-10-23 NOTE — Telephone Encounter (Signed)
Spoke with the patient and scheduled a new patient with Dr Berline Lopes on  6/8 at 9:45 am. Patient given an arrival time of 9:15am. Patient also given the address and phone number for the clinic; along with the policy for mask and visitors.

## 2021-10-24 ENCOUNTER — Encounter: Payer: 59 | Admitting: Family Medicine

## 2021-10-28 ENCOUNTER — Telehealth: Payer: Self-pay | Admitting: Obstetrics and Gynecology

## 2021-10-28 NOTE — Telephone Encounter (Signed)
Mychart message sent.

## 2021-10-29 ENCOUNTER — Encounter: Payer: Self-pay | Admitting: Gynecologic Oncology

## 2021-10-29 ENCOUNTER — Telehealth: Payer: Self-pay | Admitting: *Deleted

## 2021-10-29 NOTE — Telephone Encounter (Signed)
Moved the patient's appt from 6/8 to 6/1

## 2021-10-30 ENCOUNTER — Other Ambulatory Visit: Payer: Self-pay | Admitting: Gynecologic Oncology

## 2021-10-30 ENCOUNTER — Inpatient Hospital Stay (HOSPITAL_BASED_OUTPATIENT_CLINIC_OR_DEPARTMENT_OTHER): Payer: 59 | Admitting: Gynecologic Oncology

## 2021-10-30 ENCOUNTER — Encounter: Payer: Self-pay | Admitting: Gynecologic Oncology

## 2021-10-30 ENCOUNTER — Inpatient Hospital Stay: Payer: 59 | Attending: Gynecologic Oncology | Admitting: Gynecologic Oncology

## 2021-10-30 ENCOUNTER — Other Ambulatory Visit: Payer: Self-pay

## 2021-10-30 ENCOUNTER — Telehealth: Payer: Self-pay

## 2021-10-30 VITALS — BP 147/84 | HR 78 | Temp 98.6°F | Resp 16 | Ht 66.0 in | Wt 231.5 lb

## 2021-10-30 DIAGNOSIS — E119 Type 2 diabetes mellitus without complications: Secondary | ICD-10-CM | POA: Diagnosis not present

## 2021-10-30 DIAGNOSIS — Z86718 Personal history of other venous thrombosis and embolism: Secondary | ICD-10-CM | POA: Insufficient documentation

## 2021-10-30 DIAGNOSIS — N951 Menopausal and female climacteric states: Secondary | ICD-10-CM | POA: Insufficient documentation

## 2021-10-30 DIAGNOSIS — Z6837 Body mass index (BMI) 37.0-37.9, adult: Secondary | ICD-10-CM | POA: Insufficient documentation

## 2021-10-30 DIAGNOSIS — E785 Hyperlipidemia, unspecified: Secondary | ICD-10-CM | POA: Diagnosis not present

## 2021-10-30 DIAGNOSIS — R002 Palpitations: Secondary | ICD-10-CM | POA: Insufficient documentation

## 2021-10-30 DIAGNOSIS — E669 Obesity, unspecified: Secondary | ICD-10-CM | POA: Diagnosis not present

## 2021-10-30 DIAGNOSIS — E1169 Type 2 diabetes mellitus with other specified complication: Secondary | ICD-10-CM

## 2021-10-30 DIAGNOSIS — C541 Malignant neoplasm of endometrium: Secondary | ICD-10-CM | POA: Insufficient documentation

## 2021-10-30 DIAGNOSIS — Z79899 Other long term (current) drug therapy: Secondary | ICD-10-CM | POA: Diagnosis not present

## 2021-10-30 DIAGNOSIS — Z7984 Long term (current) use of oral hypoglycemic drugs: Secondary | ICD-10-CM | POA: Insufficient documentation

## 2021-10-30 DIAGNOSIS — N939 Abnormal uterine and vaginal bleeding, unspecified: Secondary | ICD-10-CM | POA: Diagnosis not present

## 2021-10-30 DIAGNOSIS — F419 Anxiety disorder, unspecified: Secondary | ICD-10-CM | POA: Diagnosis not present

## 2021-10-30 NOTE — Patient Instructions (Signed)
Today Dr. Berline Lopes did an endocervical curettage (sampling from the endocervix) today and we will contact you with the results. The below surgery plan may adjust based on these results.   Preparing for your Surgery   Plan for surgery on November 19, 2021 with Dr. Jeral Pinch at [place] will be scheduled for robotic assisted total laparoscopic hysterectomy (removal of the uterus and cervix), bilateral salpingo-oophorectomy (removal of both ovaries and fallopian tubes), sentinel lymph node biopsy, possible lymph node dissection, possible laparotomy (larger incision on your abdomen if needed).    Pre-operative Testing -You will receive a phone call from presurgical testing at [facility] to arrange for a pre-operative appointment and lab work.   -Bring your insurance card, copy of an advanced directive if applicable, medication list   -At that visit, you will be asked to sign a consent for a possible blood transfusion in case a transfusion becomes necessary during surgery.  The need for a blood transfusion is rare but having consent is a necessary part of your care.      -You should not be taking blood thinners or aspirin at least ten days prior to surgery unless instructed by your surgeon.   -Do not take supplements such as fish oil (omega 3), red yeast rice, turmeric before your surgery. You want to avoid medications with aspirin in them including headache powders such as BC or Goody's), Excedrin migraine.   Day Before Surgery at [place] will be asked to take in a light diet the day before surgery. You will be advised you can have clear liquids up until 3 hours before your surgery.     Eat a light diet the day before surgery.  Examples including soups, broths, toast, yogurt, mashed potatoes.  AVOID GAS PRODUCING FOODS. Things to avoid include carbonated beverages (fizzy beverages, sodas), raw fruits and raw vegetables (uncooked), or beans.    If your bowels are filled  with gas, your surgeon will have difficulty visualizing your pelvic organs which increases your surgical risks.   Your role in recovery Your role is to become active as soon as directed by your doctor, while still giving yourself time to heal.  Rest when you feel tired. You will be asked to do the following in order to speed your recovery:   - Cough and breathe deeply. This helps to clear and expand your lungs and can prevent pneumonia after surgery.  - [place]. Do mild physical activity. Walking or moving your legs help your circulation and body functions return to normal. Do not try to get up or walk alone the first time after surgery.   -If you develop swelling on one leg or the other, pain in the back of your leg, redness/warmth in one of your legs, please call the office or go to the Emergency Room to have a doppler to rule out a blood clot. For shortness of breath, chest pain-seek care in the Emergency Room as soon as possible. - Actively manage your pain. Managing your pain lets you move in comfort. We will ask you to rate your pain on a scale of zero to 10. It is your responsibility to tell your doctor or nurse where and how much you hurt so your pain can be treated.   Special Considerations -If you are diabetic, you may be placed on insulin after surgery to have closer control over your blood sugars to promote healing and recovery.  This does  not mean that you will be discharged on insulin.  If applicable, your oral antidiabetics will be resumed when you are tolerating a solid diet.   -Your final pathology results from surgery should be available around one week after surgery and the results will be relayed to you when available.   -FMLA forms can be faxed to [phone] and please allow 5-7 business days for completion.   Pain Management After Surgery -You will be prescribed your pain medication and bowel regimen medications before surgery so that you can have these  available when you are discharged from the hospital. The pain medication is for use ONLY AFTER surgery and a new prescription will not be given.    -Make sure that you have Tylenol and Ibuprofen at home to use on a regular basis after surgery for pain control. We recommend alternating the medications every hour to six hours since they work differently and are processed in the body differently for pain relief.   -Review the attached handout on narcotic use and their risks and side effects.    Bowel Regimen -You will be prescribed Sennakot-S to take nightly to prevent constipation especially if you are taking the narcotic pain medication intermittently.  It is important to prevent constipation and drink adequate amounts of liquids. You can stop taking this medication when you are not taking pain medication and you are back on your normal bowel routine.   Risks of Surgery Risks of surgery are low but include bleeding, infection, damage to surrounding structures, re-operation, blood clots, and very rarely death.     Blood Transfusion Information (For the consent to be signed before surgery)   We will be checking your blood type before surgery so in case of emergencies, we will know what type of blood you would need.                                             WHAT IS A BLOOD TRANSFUSION?   A transfusion is the replacement of blood or some of its parts. Blood is made up of multiple cells which provide different functions. Red blood cells carry oxygen and are used for blood loss replacement. White blood cells fight against infection. Platelets control bleeding. Plasma helps clot blood. Other blood products are available for specialized needs, such as hemophilia or other clotting disorders. BEFORE THE TRANSFUSION  Who gives blood for transfusions?  You may be able to donate blood to be used at a later date on yourself (autologous donation). Relatives can be asked to donate blood. This is  generally not any safer than if you have received blood from a stranger. The same precautions are taken to ensure safety when a relative's blood is donated. Healthy volunteers who are fully evaluated to make sure their blood is safe. This is blood bank blood. Transfusion therapy is the safest it has ever been in the practice of medicine. Before blood is taken from a donor, a complete history is taken to make sure that person has no history of diseases nor engages in risky social behavior (examples are intravenous drug use or sexual activity with multiple partners). The donor's travel history is screened to minimize risk of transmitting infections, such as malaria. The donated blood is tested for signs of infectious diseases, such as HIV and hepatitis. The blood is then tested to be sure it is compatible  with you in order to minimize the chance of a transfusion reaction. If you or a relative donates blood, this is often done in anticipation of surgery and is not appropriate for emergency situations. It takes many days to process the donated blood. RISKS AND COMPLICATIONS Although transfusion therapy is very safe and saves many lives, the main dangers of transfusion include:  Getting an infectious disease. Developing a transfusion reaction. This is an allergic reaction to something in the blood you were given. Every precaution is taken to prevent this. The decision to have a blood transfusion has been considered carefully by your caregiver before blood is given. Blood is not given unless the benefits outweigh the risks.   AFTER SURGERY INSTRUCTIONS   Return to work: 4-6 weeks if applicable   You will need to be on a blood thinner after surgery given your history of a DVT for a total of 2 weeks. We will send this prescription in closer to surgery. You should take this the day after surgery and take at the same time each day.   Activity: 1. Be up and out of the bed during the day.  Take a nap if needed.   You may walk up steps but be careful and use the hand rail.  Stair climbing will tire you more than you think, you may need to stop part way and rest.    2. No lifting or straining for 6 weeks over 10 pounds. No pushing, pulling, straining for 6 weeks.   3. No driving for around 1 week(s).  Do not drive if you are taking narcotic pain medicine and make sure that your reaction time has returned.    4. You can shower as soon as the next day after surgery. Shower daily.  Use your regular soap and water (not directly on the incision) and pat your incision(s) dry afterwards; don't rub.  No tub baths or submerging your body in water until cleared by your surgeon. If you have the soap that was given to you by pre-surgical testing that was used before surgery, you do not need to use it afterwards because this can irritate your incisions.    5. No sexual activity and nothing in the vagina for 8 weeks.   6. You may experience a small amount of clear drainage from your incisions, which is normal.  If the drainage persists, increases, or changes color please call the office.   7. Do not use creams, lotions, or ointments such as neosporin on your incisions after surgery until advised by your surgeon because they can cause removal of the dermabond glue on your incisions.     8. You may experience vaginal spotting after surgery or around the 6-8 week mark from surgery when the stitches at the top of the vagina begin to dissolve.  The spotting is normal but if you experience heavy bleeding, call our office.   9. Take Tylenol or ibuprofen first for pain and only use narcotic pain medication for severe pain not relieved by the Tylenol or Ibuprofen.  Monitor your Tylenol intake to a max of 4,000 mg in a 24 hour period. You can alternate these medications after surgery.   Diet: 1. Low sodium Heart Healthy Diet is recommended but you are cleared to resume your normal (before surgery) diet after your procedure.   2.  It is safe to use a laxative, such as Miralax or Colace, if you have difficulty moving your bowels. You have been prescribed Sennakot-S  to take at bedtime every evening after surgery to keep bowel movements regular and to prevent constipation.     Wound Care: 1. Keep clean and dry.  Shower daily.   Reasons to call the Doctor: Fever - Oral temperature greater than 100.4 degrees Fahrenheit Foul-smelling vaginal discharge Difficulty urinating Nausea and vomiting Increased pain at the site of the incision that is unrelieved with pain medicine. Difficulty breathing with or without chest pain New calf pain especially if only on one side Sudden, continuing increased vaginal bleeding with or without clots.   Contacts: For questions or concerns you should contact:   Dr. Jeral Pinch at [phone]   Joylene John, NP at [phone]   After Hours: call [phone] and have the GYN Oncologist paged/contacted (after 5 pm or on the weekends).   Messages sent via mychart are for non-urgent matters and are not responded to after hours so for urgent needs, please call the after hours number.

## 2021-10-30 NOTE — Telephone Encounter (Signed)
Yes, I agree. She doesn't need to come in tomorrow. Thanks.

## 2021-10-30 NOTE — Progress Notes (Signed)
Patient here with her husband for new patient consultation with Dr. Jeral Pinch and for a pre-operative discussion prior to her scheduled surgery on November 19, 2021. She is scheduled for robotic assisted total laparoscopic hysterectomy, bilateral salpingo-oophorectomy, sentinel lymph node biopsy, possible lymph node dissection, possible laparotomy. The surgery was discussed in detail.  See after visit summary for additional details. Visual aids used to discuss items related to surgery including sequential compression stockings, foley catheter, IV pump, multi-modal pain regimen including tylenol, photo of the surgical robot, female reproductive system to discuss surgery in detail.      Discussed post-op pain management in detail including the aspects of the enhanced recovery pathway.  Advised her that a new prescription would be sent in closer to the surgery date, to be used for after her upcoming surgery.  We discussed the use of tylenol post-op and to monitor for a maximum of 4,000 mg in a 24 hour period.  Also plan to prescribe sennakot to be used after surgery and to hold if having loose stools.  Discussed bowel regimen in detail.     Discussed the use of SCDs and measures to take at home to prevent DVT including frequent mobility.  Reportable signs and symptoms of DVT discussed. Post-operative instructions discussed and expectations for after surgery. Incisional care discussed as well including reportable signs and symptoms including erythema, drainage, wound separation.     10 minutes spent with the patient.  Verbalizing understanding of material discussed. No needs or concerns voiced at the end of the visit.  Advised patient and family to call for any needs. Advised that her post-operative medications had been prescribed and could be picked up at any time.   She will be contacted with the results of her endocervical curettage from today. She will also work on obtaining a glucose meter to monitor her  glucose prior to surgery. She will let our office know if she needs assistance with obtaining a meter, strips etc.   This appointment is included in the global surgical bundle as pre-operative teaching and has no charge.

## 2021-10-30 NOTE — Telephone Encounter (Signed)
Pt notified that msg was received and appt was cancelled.

## 2021-10-30 NOTE — H&P (View-Only) (Signed)
GYNECOLOGIC ONCOLOGY NEW PATIENT CONSULTATION   Patient Name: Cassie Rich  Patient Age: 46 y.o. Date of Service: 10/30/21 Referring Provider: Dr. 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   I discussed in detail work-up that the patient has undergone thus far.  While I favor that she has endometrial cancer, given her HPV on recent Pap tests, endocervical polyp, and ECC showing adenocarcinoma, I stressed the importance of ruling out an endocervical origin of her cancer  I reached out to the pathologist who reviewed her pathology from her most recent surgery.  The majority of the adenocarcinoma appreciated with within an endometrial polyp.  There was benign endocervical tissue noted.  He strongly favors that this is an endometrial process.  I also sent a message to the patient's gynecologist to review and a little bit more detail findings at the time of surgery.  It appears that her endocervical polyp was removed, but I am not sure in which specimen this was sent.  What I suggested today was that an endocervical curettage be repeated.  If this is benign, then this supports a uterine primary.  If the ECC continues to show adenocarcinoma, while this certainly can be shedding from the uterine tumor, we may move forward with additional imaging (such as a pelvic MRI) or procedure (conization).  I will plan to call her with the results once I have them.  Presuming that biopsy from today supports an endometrial primary, we reviewed the nature of endometrial cancer and its recommended surgical staging, including total hysterectomy, bilateral salpingo-oophorectomy, and lymph node assessment.  Given the patient's 1-2 years of hot flashes, which suggest that she is perimenopausal, we discussed the risks and benefits of ovarian preservation versus  removal.  She would like to proceed with ovarian excision.  The patient is a suitable candidate for staging via a minimally invasive approach to surgery.  We reviewed that robotic assistance would be used to complete the surgery.   We discussed that most endometrial cancer is detected early and that decisions regarding adjuvant therapy will be made based on her final pathology.   We reviewed the sentinel lymph node technique. Risks and benefits of sentinel lymph node biopsy was reviewed. We reviewed the technique and ICG dye. The patient DOES NOT have an iodine allergy or known liver dysfunction. We reviewed the false negative rate (0.4%), and that 3% of patients with metastatic disease will not have it detected by SLN biopsy in endometrial cancer. A low risk of allergic reaction to the dye, <0.2% for ICG, has been reported. We also discussed that in the case of failed mapping, which occurs 40% of the time, a bilateral or unilateral lymphadenectomy will be performed at the surgeon's discretion.   Potential benefits of sentinel nodes including a higher detection rate for metastasis due to ultrastaging and potential reduction in operative morbidity. However, there remains uncertainty as to the role for treatment of micrometastatic disease. Further, the benefit of operative morbidity associated with the SLN technique in endometrial cancer is not yet completely known. In other patient populations (e.g. the cervical cancer population) there has been observed reductions in morbidity with SLN biopsy compared to pelvic lymphadenectomy. Lymphedema, nerve dysfunction and lymphocysts are all potential risks with the SLN technique as with complete lymphadenectomy. Additional risks to the patient include the risk of damage to an internal organ while operating in  an altered view (e.g. the black and white image of the robotic fluorescence imaging mode).   We discussed the plan for a robotic assisted hysterectomy, bilateral  salpingo-oophorectomy, sentinel lymph node evaluation, possible lymph node dissection, possible laparotomy. The risks of surgery were discussed in detail and she understands these to include infection; wound separation; hernia; vaginal cuff separation, injury to adjacent organs such as bowel, bladder, blood vessels, ureters and nerves; bleeding which may require blood transfusion; anesthesia risk; thromboembolic events; possible death; unforeseen complications; possible need for re-exploration; medical complications such as heart attack, stroke, pleural effusion and pneumonia; and, if full lymphadenectomy is performed the risk of lymphedema and lymphocyst. The patient will receive DVT and antibiotic prophylaxis as indicated. She voiced a clear understanding. She had the opportunity to ask questions. Perioperative instructions were reviewed with her. Prescriptions for post-op medications were sent to her pharmacy of choice.  In the setting of her type 2 diabetes, I stressed the importance of good glycemic control to decrease her perioperative morbidity.  We will plan to have her work on getting a glucose meter over the next few days.  We will tentatively schedule her for surgery within the next month if we can document that her blood sugars are running under 180-200.  Given her history of DVT after orthopedic injury, and in the setting of major abdominal surgery and cancer diagnosis, I recommend that plan for 2 weeks of prophylactic anticoagulation.  A copy of this note was sent to the patient's referring provider.   Jeral Pinch, MD  Division of Gynecologic Oncology  Department of Obstetrics and Gynecology  [facility]  ___________________________________________  Chief Complaint: Chief Complaint  Patient presents with   Endometrial cancer [facility])    History of Present Illness:  Cassie Rich is a 46 y.o. y.o. female who is seen in consultation at the request of  Dr. Talbert Nan for an evaluation of endometrial cancer.  Patient with a history of abnormal uterine bleeding.  She describes this as a menstrual cycle every 1-3 months.  Menses last for 7 days, often very light after the second day.  She denies any associated pain or cramping.  Since her recent D&C surgery, she had heavy bleeding for about a day and has had light brown spotting requiring the use of a panty liner since.  Beginning last year, she would have intermittent postcoital bleeding.  Denies any intermenstrual spotting or spotting at other times.  Pelvic ultrasound exam in January of this year showed endometrial lining measuring 12 mm with mild amount of hypoechoic debris suspected within the endometrial canal and lower uterine segment.  Repeat ultrasound on 08/08/2021 showed persistent 1.8 x 1.1 x 3.4 cm mass at the lower uterine segment containing internal blood flow, most consistent with an endometrial polyp.  Colposcopy with cervical biopsy and ECC was performed on 10/16/2021.  Cervical biopsy showed scant fragment of squamous mucosa.  ECC showed adenocarcinoma, endometrioid, with differential diagnosis including endometrial and endocervical origin.  On 10/20/2021, patient underwent hysteroscopy, resection of endometrial polyp, and dilation and curettage.  Findings included a thickened endometrium, sessile mass on the posterior uterine wall, and endocervical polyp.  Final pathology from this revealed well differentiated endometrioid adenocarcinoma from the endometrial mass resection.  Tumor at least partially involves an endometrial polyp.  There is background scant benign proliferative phase endometrium as well as scant benign endocervix and lower uterine segment.  The D&C specimen also showed FIGO grade 1 endometrioid adenocarcinoma, background of  benign proliferative phase endometrium.  Yuko inflammatory debris with Lugol's consistent with prior procedure as well as benign endocervix and ectocervical  squamous epithelium.  Pap history: 2009 - negative 2011 - negative 2014 - negative 12/2016 - negative, HR HPV+ 10/2020 - negative, + endometrial cells, + HR HPV 09/2021 - negative, + HR HPV  Patient endorses having some anxiety as well as palpitations since her diagnosis.  She endorses normal bowel and bladder function.  She notes that appetite has been so-so, which she attributes to anxiety.  Denies any nausea or emesis.  The patient's history is notable for type 2 diabetes with a hemoglobin A1c a month ago over 8%.  She is not yet checking sugars at home, is only on metformin.  She also has a history of DVT in 2010 after an ankle fracture (required casting, no surgery).   PAST MEDICAL HISTORY:  Past Medical History:  Diagnosis Date   Abnormal uterine bleeding (AUB)    Endometrial polyp    Hemorrhoids    History of DVT of lower extremity 12/19/2008   RLE  in setting right ankle fx and long car trip from [place];  completed blood thinner   Hyperlipidemia    Type 2 diabetes mellitus ([place])    followed by pcp---  (10-16-2021 pt given metformin but per pt is trying diet controlled until has A1c checked again,  stated does not check blood sugar at home)   Wears glasses      PAST SURGICAL HISTORY:  Past Surgical History:  Procedure Laterality Date   DILATATION & CURETTAGE/HYSTEROSCOPY WITH MYOSURE N/A 10/20/2021   Procedure: DILATATION & CURETTAGE/HYSTEROSCOPY WITH MYOSURE;  Surgeon: Salvadore Dom, MD;  Location: [facility];  Service: Gynecology;  Laterality: N/A;   FLEXIBLE SIGMOIDOSCOPY  07/2009   '@[facility]'$  by dr Deatra Ina;   w/ external hemorrhoid banding ligation   LAPAROSCOPY WITH TUBAL LIGATION Bilateral 12/02/1999   '@[facility]'$ ;  w/ electrocautery    OB/GYN HISTORY:  OB History  Gravida Para Term Preterm AB Living  4 2     0 2  SAB IAB Ectopic Multiple Live Births  0   0        # Outcome Date GA Lbr Len/2nd Weight Sex Delivery Anes PTL Lv  4 Gravida           3  Gravida           2 Para           1 Para             No LMP recorded.  Age at menarche: 16 Age at menopause: Not applicable Hx of HRT: Denies Hx of STDs: HPV Last pap: 09/2021 - negative, HR HPV + History of abnormal pap smears: Denies  SCREENING STUDIES:  Last mammogram: 2022  Last colonoscopy: 2011  MEDICATIONS: Outpatient Encounter Medications as of 10/30/2021  Medication Sig   Cholecalciferol (VITAMIN D3 PO) Take by mouth daily.   Cyanocobalamin (B-12 PO) Take by mouth daily.   ibuprofen (ADVIL,MOTRIN) 200 MG tablet Take 200 mg by mouth as needed.   metFORMIN (GLUCOPHAGE-XR) 500 MG 24 hr tablet Take 500 mg by mouth 2 (two) times daily. (Patient not taking: Reported on 10/16/2021)   No facility-administered encounter medications on file as of 10/30/2021.    ALLERGIES:  No Known Allergies   FAMILY HISTORY:  Family History  Problem Relation Age of Onset   Diabetes Mother    Breast cancer Neg Hx  Ovarian cancer Neg Hx    Endometrial cancer Neg Hx    Pancreatic cancer Neg Hx    Prostate cancer Neg Hx    Colon cancer Neg Hx      SOCIAL HISTORY:  Social Connections: Not on file    REVIEW OF SYSTEMS:  + Anxiety and palpitations in the setting of recent diagnosis, lump/mass in the neck, urinary frequency, hot flashes, swollen lymph nodes, decreased concentration. Denies appetite changes, fevers, chills, fatigue, unexplained weight changes. Denies hearing loss, neck lumps or masses, mouth sores, ringing in ears or voice changes. Denies cough or wheezing.  Denies shortness of breath. Denies chest pain. Denies leg swelling. Denies abdominal distention, pain, blood in stools, constipation, diarrhea, nausea, vomiting, or early satiety. Denies pain with intercourse, dysuria, hematuria or incontinence. Denies hot flashes, pelvic pain, vaginal bleeding or vaginal discharge.   Denies joint pain, back pain or muscle pain/cramps. Denies itching, rash, or wounds. Denies  dizziness, headaches, numbness or seizures. Denies easy bruising or bleeding. Denies depression, confusion.  Physical Exam:  Vital Signs for this encounter:  Blood pressure (!) 147/84, pulse 78, temperature 98.6 F (37 C), temperature source Oral, resp. rate 16, height '5\' 6"'$  (1.676 m), weight 231 lb 8 oz (105 kg), SpO2 100 %. Body mass index is 37.37 kg/m. General: Alert, oriented, no acute distress.  HEENT: Normocephalic, atraumatic. Sclera anicteric.  Chest: Clear to auscultation bilaterally. No wheezes, rhonchi, or rales. Cardiovascular: Regular rate and rhythm, no murmurs, rubs, or gallops.  Abdomen: Obese. Normoactive bowel sounds. Soft, nondistended, nontender to palpation. No masses or hepatosplenomegaly appreciated. No palpable fluid wave.  Extremities: Grossly normal range of motion. Warm, well perfused. No edema bilaterally.  Skin: No rashes or lesions.  Lymphatics: No cervical, supraclavicular, or inguinal adenopathy.  GU:  Normal external female genitalia. No lesions. No discharge or bleeding.             Bladder/urethra:  No lesions or masses, well supported bladder             Vagina: Well rugated, no lesions or masses noted.             Cervix: Normal appearing, no lesions.  Somewhat posterior facing, cervical os mildly stenotic.  Cervix is smooth, not firm on palpation.             Uterus: Small, mobile, anteflexed, no parametrial involvement or nodularity.             Adnexa: No masses appreciated.  Rectal: No nodularity or parametrial involvement.  ECC procedure Preoperative diagnosis: Endometrial versus endocervical cancer Postoperative diagnosis: Same as above Procedure: Endocervical curettage Specimen: Endocervical curettings Estimated blood loss: Minimal Procedure: After the procedure was discussed with the patient including risks and benefits, she signed informed consent.  She was then placed in dorsolithotomy position and the cervix was visualized.  Cervix was  cleansed with Betadine x3.  Both the Cytobrush and the Kevorkian curette were then used to perform an endocervical curettage.  Specimen was collected and placed in formalin.  All instruments were removed from the vagina.  Overall the patient tolerated the procedure well.  LABORATORY AND RADIOLOGIC DATA:  Outside medical records were reviewed to synthesize the above history, along with the history and physical obtained during the visit.   Lab Results  Component Value Date   WBC 9.5 09/29/2021   HGB 14.6 10/20/2021   HCT 43.0 10/20/2021   PLT 428 (H) 09/29/2021   GLUCOSE 174 (H) 10/20/2021  CHOL 148 09/29/2021   TRIG 108 09/29/2021   HDL 44 (L) 09/29/2021   LDLCALC 84 09/29/2021   ALT 19 09/29/2021   AST 23 09/29/2021   NA 138 10/20/2021   K 4.1 10/20/2021   CL 102 10/20/2021   CREATININE 0.70 10/20/2021   BUN 13 10/20/2021   CO2 23 09/29/2021   TSH 2.04 09/29/2021   INR 1.1 04/30/2009   HGBA1C 8.5 (H) 09/29/2021   MICROALBUR 3.8 02/08/2020

## 2021-10-30 NOTE — Telephone Encounter (Signed)
Pt called and LVM stating she saw Dr. Berline Lopes today and Dr. Berline Lopes informed her that it would not be necessary for her to see you tomorrow for her f/u post op appt. Let me know if you are ok with this and Ill let appt desk know. Thanks.

## 2021-10-30 NOTE — Progress Notes (Unsigned)
GYNECOLOGIC ONCOLOGY NEW PATIENT CONSULTATION   Patient Name: Cassie Rich  Patient Age: 46 y.o. Date of Service: 10/30/21 Referring Provider: Dr. 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   I discussed in detail work-up that the patient has undergone thus far.  While I favor that she has endometrial cancer, given her HPV on recent Pap tests, endocervical polyp, and ECC showing adenocarcinoma, I stressed the importance of ruling out an endocervical origin of her cancer  I reached out to the pathologist who reviewed her pathology from her most recent surgery.  The majority of the adenocarcinoma appreciated with within an endometrial polyp.  There was benign endocervical tissue noted.  He strongly favors that this is an endometrial process.  I also sent a message to the patient's gynecologist to review and a little bit more detail findings at the time of surgery.  It appears that her endocervical polyp was removed, but I am not sure in which specimen this was sent.  What I suggested today was that an endocervical curettage be repeated.  If this is benign, then this supports a uterine primary.  If the ECC continues to show adenocarcinoma, while this certainly can be shedding from the uterine tumor, we may move forward with additional imaging (such as a pelvic MRI) or procedure (conization).  I will plan to call her with the results once I have them.  Presuming that biopsy from today supports an endometrial primary, we reviewed the nature of endometrial cancer and its recommended surgical staging, including total hysterectomy, bilateral salpingo-oophorectomy, and lymph node assessment.  Given the patient's 1-2 years of hot flashes, which suggest that she is perimenopausal, we discussed the risks and benefits of ovarian preservation versus  removal.  She would like to proceed with ovarian excision.  The patient is a suitable candidate for staging via a minimally invasive approach to surgery.  We reviewed that robotic assistance would be used to complete the surgery.   We discussed that most endometrial cancer is detected early and that decisions regarding adjuvant therapy will be made based on her final pathology.   We reviewed the sentinel lymph node technique. Risks and benefits of sentinel lymph node biopsy was reviewed. We reviewed the technique and ICG dye. The patient DOES NOT have an iodine allergy or known liver dysfunction. We reviewed the false negative rate (0.4%), and that 3% of patients with metastatic disease will not have it detected by SLN biopsy in endometrial cancer. A low risk of allergic reaction to the dye, <0.2% for ICG, has been reported. We also discussed that in the case of failed mapping, which occurs 40% of the time, a bilateral or unilateral lymphadenectomy will be performed at the surgeon's discretion.   Potential benefits of sentinel nodes including a higher detection rate for metastasis due to ultrastaging and potential reduction in operative morbidity. However, there remains uncertainty as to the role for treatment of micrometastatic disease. Further, the benefit of operative morbidity associated with the SLN technique in endometrial cancer is not yet completely known. In other patient populations (e.g. the cervical cancer population) there has been observed reductions in morbidity with SLN biopsy compared to pelvic lymphadenectomy. Lymphedema, nerve dysfunction and lymphocysts are all potential risks with the SLN technique as with complete lymphadenectomy. Additional risks to the patient include the risk of damage to an internal organ while operating in  an altered view (e.g. the black and white image of the robotic fluorescence imaging mode).   We discussed the plan for a robotic assisted hysterectomy, bilateral  salpingo-oophorectomy, sentinel lymph node evaluation, possible lymph node dissection, possible laparotomy. The risks of surgery were discussed in detail and she understands these to include infection; wound separation; hernia; vaginal cuff separation, injury to adjacent organs such as bowel, bladder, blood vessels, ureters and nerves; bleeding which may require blood transfusion; anesthesia risk; thromboembolic events; possible death; unforeseen complications; possible need for re-exploration; medical complications such as heart attack, stroke, pleural effusion and pneumonia; and, if full lymphadenectomy is performed the risk of lymphedema and lymphocyst. The patient will receive DVT and antibiotic prophylaxis as indicated. She voiced a clear understanding. She had the opportunity to ask questions. Perioperative instructions were reviewed with her. Prescriptions for post-op medications were sent to her pharmacy of choice.  In the setting of her type 2 diabetes, I stressed the importance of good glycemic control to decrease her perioperative morbidity.  We will plan to have her work on getting a glucose meter over the next few days.  We will tentatively schedule her for surgery within the next month if we can document that her blood sugars are running under 180-200.  Given her history of DVT after orthopedic injury, in the setting of major abdominal surgery and cancer diagnosis, I recommend that we do 2 weeks of prophylactic anticoagulation.  A copy of this note was sent to the patient's referring provider.   Eugene Garnet, MD  Division of Gynecologic Oncology  Department of Obstetrics and Gynecology  [facility]  ___________________________________________  Chief Complaint: Chief Complaint  Patient presents with   Endometrial cancer [facility])    History of Present Illness:  Cassie Rich is a 46 y.o. y.o. female who is seen in consultation at the request of Dr.  Oscar La for an evaluation of endometrial cancer.  Patient with a history of abnormal uterine bleeding.  She describes this as a menstrual cycle every 1-3 months.  Menses last for 7 days, often very light after the second day.  She denies any associated pain or cramping.  Since her recent D&C surgery, she had heavy bleeding for about a day and has had light brown spotting requiring the use of a panty liner since.  Beginning last year, she would have intermittent postcoital bleeding.  Denies any intermenstrual spotting or spotting at other times.  Pelvic ultrasound exam in January of this year showed endometrial lining measuring 12 mm with mild amount of hypoechoic debris suspected within the endometrial canal and lower uterine segment.  Repeat ultrasound on 08/08/2021 showed persistent 1.8 x 1.1 x 3.4 cm mass at the lower uterine segment containing internal blood flow, most consistent with an endometrial polyp.  Colposcopy with cervical biopsy and ECC was performed on 10/16/2021.  Cervical biopsy showed scant fragment of squamous mucosa.  ECC showed adenocarcinoma, endometrioid, with differential diagnosis including endometrial and endocervical origin.  On 10/20/2021, patient underwent hysteroscopy, resection of endometrial polyp, and dilation and curettage.  Findings included a thickened endometrium, sessile mass on the posterior uterine wall, and endocervical polyp.  Final pathology from this revealed well differentiated endometrioid adenocarcinoma from the endometrial mass resection.  Tumor at least partially involves an endometrial polyp.  There is background scant benign proliferative phase endometrium as well as scant benign endocervix and lower uterine segment.  The D&C specimen also showed FIGO grade 1 endometrioid adenocarcinoma, background of benign  proliferative phase endometrium.  Yuko inflammatory debris with Lugol's consistent with prior procedure as well as benign endocervix and ectocervical squamous  epithelium.  Pap history: 2009 - negative 2011 - negative 2014 - negative 12/2016 - negative, HR HPV+ 10/2020 - negative, + endometrial cells, + HR HPV 09/2021 - negative, + HR HPV  Patient endorses having some anxiety as well as palpitations since her diagnosis.  She endorses normal bowel and bladder function.  She notes that appetite has been so-so, which she attributes to anxiety.  Denies any nausea or emesis.  The patient's history is notable for type 2 diabetes with a hemoglobin A1c a month ago over 8%.  She is not yet checking sugars at home, is only on metformin.  She also has a history of DVT in 2010 after an ankle fracture (required casting, no surgery).   PAST MEDICAL HISTORY:  Past Medical History:  Diagnosis Date   Abnormal uterine bleeding (AUB)    Endometrial polyp    Hemorrhoids    History of DVT of lower extremity 12/19/2008   RLE  in setting right ankle fx and long car trip from [place];  completed blood thinner   Hyperlipidemia    Type 2 diabetes mellitus (HCC)    followed by pcp---  (10-16-2021 pt given metformin but per pt is trying diet controlled until has A1c checked again,  stated does not check blood sugar at home)   Wears glasses      PAST SURGICAL HISTORY:  Past Surgical History:  Procedure Laterality Date   DILATATION & CURETTAGE/HYSTEROSCOPY WITH MYOSURE N/A 10/20/2021   Procedure: DILATATION & CURETTAGE/HYSTEROSCOPY WITH MYOSURE;  Surgeon: Romualdo Bolk, MD;  Location: [facility];  Service: Gynecology;  Laterality: N/A;   FLEXIBLE SIGMOIDOSCOPY  07/2009   @[facility]  by dr Arlyce Dice;   w/ external hemorrhoid banding ligation   LAPAROSCOPY WITH TUBAL LIGATION Bilateral 12/02/1999   @[facility] ;  w/ electrocautery    OB/GYN HISTORY:  OB History  Gravida Para Term Preterm AB Living  4 2     0 2  SAB IAB Ectopic Multiple Live Births  0   0        # Outcome Date GA Lbr Len/2nd Weight Sex Delivery Anes PTL Lv  4 Gravida           3 Gravida            2 Para           1 Para             No LMP recorded.  Age at menarche: 58 Age at menopause: Not applicable Hx of HRT: Denies Hx of STDs: HPV Last pap: 09/2021 - negative, HR HPV + History of abnormal pap smears: Denies  SCREENING STUDIES:  Last mammogram: 2022  Last colonoscopy: 2011  MEDICATIONS: Outpatient Encounter Medications as of 10/30/2021  Medication Sig   Cholecalciferol (VITAMIN D3 PO) Take by mouth daily.   Cyanocobalamin (B-12 PO) Take by mouth daily.   ibuprofen (ADVIL,MOTRIN) 200 MG tablet Take 200 mg by mouth as needed.   metFORMIN (GLUCOPHAGE-XR) 500 MG 24 hr tablet Take 500 mg by mouth 2 (two) times daily. (Patient not taking: Reported on 10/16/2021)   No facility-administered encounter medications on file as of 10/30/2021.    ALLERGIES:  No Known Allergies   FAMILY HISTORY:  Family History  Problem Relation Age of Onset   Diabetes Mother    Breast cancer Neg Hx    Ovarian  cancer Neg Hx    Endometrial cancer Neg Hx    Pancreatic cancer Neg Hx    Prostate cancer Neg Hx    Colon cancer Neg Hx      SOCIAL HISTORY:  Social Connections: Not on file    REVIEW OF SYSTEMS:  + Anxiety and palpitations in the setting of recent diagnosis, lump/mass in the neck, urinary frequency, hot flashes, swollen lymph nodes, decreased concentration. Denies appetite changes, fevers, chills, fatigue, unexplained weight changes. Denies hearing loss, neck lumps or masses, mouth sores, ringing in ears or voice changes. Denies cough or wheezing.  Denies shortness of breath. Denies chest pain. Denies leg swelling. Denies abdominal distention, pain, blood in stools, constipation, diarrhea, nausea, vomiting, or early satiety. Denies pain with intercourse, dysuria, hematuria or incontinence. Denies hot flashes, pelvic pain, vaginal bleeding or vaginal discharge.   Denies joint pain, back pain or muscle pain/cramps. Denies itching, rash, or wounds. Denies dizziness, headaches,  numbness or seizures. Denies easy bruising or bleeding. Denies depression, confusion.  Physical Exam:  Vital Signs for this encounter:  Blood pressure (!) 147/84, pulse 78, temperature 98.6 F (37 C), temperature source Oral, resp. rate 16, height 5\' 6"  (1.676 m), weight 231 lb 8 oz (105 kg), SpO2 100 %. Body mass index is 37.37 kg/m. General: Alert, oriented, no acute distress.  HEENT: Normocephalic, atraumatic. Sclera anicteric.  Chest: Clear to auscultation bilaterally. No wheezes, rhonchi, or rales. Cardiovascular: Regular rate and rhythm, no murmurs, rubs, or gallops.  Abdomen: Obese. Normoactive bowel sounds. Soft, nondistended, nontender to palpation. No masses or hepatosplenomegaly appreciated. No palpable fluid wave.  Extremities: Grossly normal range of motion. Warm, well perfused. No edema bilaterally.  Skin: No rashes or lesions.  Lymphatics: No cervical, supraclavicular, or inguinal adenopathy.  GU:  Normal external female genitalia. No lesions. No discharge or bleeding.             Bladder/urethra:  No lesions or masses, well supported bladder             Vagina: Well rugated, no lesions or masses noted.             Cervix: Normal appearing, no lesions.  Somewhat posterior facing, cervical os mildly stenotic.  Cervix is smooth, not firm on palpation.             Uterus: Small, mobile, anteflexed, no parametrial involvement or nodularity.             Adnexa: No masses appreciated.  Rectal: No nodularity or parametrial involvement.  ECC procedure Preoperative diagnosis: Endometrial versus endocervical cancer Postoperative diagnosis: Same as above Procedure: Endocervical curettage Specimen: Endocervical curettings Estimated blood loss: Minimal Procedure: After the procedure was discussed with the patient including risks and benefits, she signed informed consent.  She was then placed in dorsolithotomy position and the cervix was visualized.  Cervix was cleansed with Betadine  x3.  Both the Cytobrush and the Kevorkian curette were then used to perform an endocervical curettage.  Specimen was collected and placed in formalin.  All instruments were removed from the vagina.  Overall the patient tolerated the procedure well.  LABORATORY AND RADIOLOGIC DATA:  Outside medical records were reviewed to synthesize the above history, along with the history and physical obtained during the visit.   Lab Results  Component Value Date   WBC 9.5 09/29/2021   HGB 14.6 10/20/2021   HCT 43.0 10/20/2021   PLT 428 (H) 09/29/2021   GLUCOSE 174 (H) 10/20/2021   CHOL  148 09/29/2021   TRIG 108 09/29/2021   HDL 44 (L) 09/29/2021   LDLCALC 84 09/29/2021   ALT 19 09/29/2021   AST 23 09/29/2021   NA 138 10/20/2021   K 4.1 10/20/2021   CL 102 10/20/2021   CREATININE 0.70 10/20/2021   BUN 13 10/20/2021   CO2 23 09/29/2021   TSH 2.04 09/29/2021   INR 1.1 04/30/2009   HGBA1C 8.5 (H) 09/29/2021   MICROALBUR 3.8 02/08/2020

## 2021-10-30 NOTE — Patient Instructions (Signed)
Today Dr. Berline Lopes did an endocervical curettage (sampling from the endocervix) today and we will contact you with the results. The below surgery plan may adjust based on these results.  Preparing for your Surgery  Plan for surgery on November 19, 2021 with Dr. Jeral Pinch at [facility] will be scheduled for robotic assisted total laparoscopic hysterectomy (removal of the uterus and cervix), bilateral salpingo-oophorectomy (removal of both ovaries and fallopian tubes), sentinel lymph node biopsy, possible lymph node dissection, possible laparotomy (larger incision on your abdomen if needed).   Pre-operative Testing -You will receive a phone call from presurgical testing at [facility] to arrange for a pre-operative appointment and lab work.  -Bring your insurance card, copy of an advanced directive if applicable, medication list  -At that visit, you will be asked to sign a consent for a possible blood transfusion in case a transfusion becomes necessary during surgery.  The need for a blood transfusion is rare but having consent is a necessary part of your care.     -You should not be taking blood thinners or aspirin at least ten days prior to surgery unless instructed by your surgeon.  -Do not take supplements such as fish oil (omega 3), red yeast rice, turmeric before your surgery. You want to avoid medications with aspirin in them including headache powders such as BC or Goody's), Excedrin migraine.  Day Before Surgery at [facility] will be asked to take in a light diet the day before surgery. You will be advised you can have clear liquids up until 3 hours before your surgery.    Eat a light diet the day before surgery.  Examples including soups, broths, toast, yogurt, mashed potatoes.  AVOID GAS PRODUCING FOODS. Things to avoid include carbonated beverages (fizzy beverages, sodas), raw fruits and raw vegetables (uncooked), or beans.   If your bowels are filled with gas,  your surgeon will have difficulty visualizing your pelvic organs which increases your surgical risks.  Your role in recovery Your role is to become active as soon as directed by your doctor, while still giving yourself time to heal.  Rest when you feel tired. You will be asked to do the following in order to speed your recovery:  - Cough and breathe deeply. This helps to clear and expand your lungs and can prevent pneumonia after surgery.  - [place]. Do mild physical activity. Walking or moving your legs help your circulation and body functions return to normal. Do not try to get up or walk alone the first time after surgery.   -If you develop swelling on one leg or the other, pain in the back of your leg, redness/warmth in one of your legs, please call the office or go to the Emergency Room to have a doppler to rule out a blood clot. For shortness of breath, chest pain-seek care in the Emergency Room as soon as possible. - Actively manage your pain. Managing your pain lets you move in comfort. We will ask you to rate your pain on a scale of zero to 10. It is your responsibility to tell your doctor or nurse where and how much you hurt so your pain can be treated.  Special Considerations -If you are diabetic, you may be placed on insulin after surgery to have closer control over your blood sugars to promote healing and recovery.  This does not mean that you will be discharged on insulin.  If applicable, your  oral antidiabetics will be resumed when you are tolerating a solid diet.  -Your final pathology results from surgery should be available around one week after surgery and the results will be relayed to you when available.  -FMLA forms can be faxed to [phone] and please allow 5-7 business days for completion.  Pain Management After Surgery -You will be prescribed your pain medication and bowel regimen medications before surgery so that you can have these available when  you are discharged from the hospital. The pain medication is for use ONLY AFTER surgery and a new prescription will not be given.   -Make sure that you have Tylenol and Ibuprofen at home to use on a regular basis after surgery for pain control. We recommend alternating the medications every hour to six hours since they work differently and are processed in the body differently for pain relief.  -Review the attached handout on narcotic use and their risks and side effects.   Bowel Regimen -You will be prescribed Sennakot-S to take nightly to prevent constipation especially if you are taking the narcotic pain medication intermittently.  It is important to prevent constipation and drink adequate amounts of liquids. You can stop taking this medication when you are not taking pain medication and you are back on your normal bowel routine.  Risks of Surgery Risks of surgery are low but include bleeding, infection, damage to surrounding structures, re-operation, blood clots, and very rarely death.   Blood Transfusion Information (For the consent to be signed before surgery)  We will be checking your blood type before surgery so in case of emergencies, we will know what type of blood you would need.                                            WHAT IS A BLOOD TRANSFUSION?  A transfusion is the replacement of blood or some of its parts. Blood is made up of multiple cells which provide different functions. Red blood cells carry oxygen and are used for blood loss replacement. White blood cells fight against infection. Platelets control bleeding. Plasma helps clot blood. Other blood products are available for specialized needs, such as hemophilia or other clotting disorders. BEFORE THE TRANSFUSION  Who gives blood for transfusions?  You may be able to donate blood to be used at a later date on yourself (autologous donation). Relatives can be asked to donate blood. This is generally not any safer than if  you have received blood from a stranger. The same precautions are taken to ensure safety when a relative's blood is donated. Healthy volunteers who are fully evaluated to make sure their blood is safe. This is blood bank blood. Transfusion therapy is the safest it has ever been in the practice of medicine. Before blood is taken from a donor, a complete history is taken to make sure that person has no history of diseases nor engages in risky social behavior (examples are intravenous drug use or sexual activity with multiple partners). The donor's travel history is screened to minimize risk of transmitting infections, such as malaria. The donated blood is tested for signs of infectious diseases, such as HIV and hepatitis. The blood is then tested to be sure it is compatible with you in order to minimize the chance of a transfusion reaction. If you or a relative donates blood, this is often done in anticipation  of surgery and is not appropriate for emergency situations. It takes many days to process the donated blood. RISKS AND COMPLICATIONS Although transfusion therapy is very safe and saves many lives, the main dangers of transfusion include:  Getting an infectious disease. Developing a transfusion reaction. This is an allergic reaction to something in the blood you were given. Every precaution is taken to prevent this. The decision to have a blood transfusion has been considered carefully by your caregiver before blood is given. Blood is not given unless the benefits outweigh the risks.  AFTER SURGERY INSTRUCTIONS  Return to work: 4-6 weeks if applicable  You will need to be on a blood thinner after surgery given your history of a DVT for a total of 2 weeks. We will send this prescription in closer to surgery. You should take this the day after surgery and take at the same time each day.  Activity: 1. Be up and out of the bed during the day.  Take a nap if needed.  You may walk up steps but be  careful and use the hand rail.  Stair climbing will tire you more than you think, you may need to stop part way and rest.   2. No lifting or straining for 6 weeks over 10 pounds. No pushing, pulling, straining for 6 weeks.  3. No driving for around 1 week(s).  Do not drive if you are taking narcotic pain medicine and make sure that your reaction time has returned.   4. You can shower as soon as the next day after surgery. Shower daily.  Use your regular soap and water (not directly on the incision) and pat your incision(s) dry afterwards; don't rub.  No tub baths or submerging your body in water until cleared by your surgeon. If you have the soap that was given to you by pre-surgical testing that was used before surgery, you do not need to use it afterwards because this can irritate your incisions.   5. No sexual activity and nothing in the vagina for 8 weeks.  6. You may experience a small amount of clear drainage from your incisions, which is normal.  If the drainage persists, increases, or changes color please call the office.  7. Do not use creams, lotions, or ointments such as neosporin on your incisions after surgery until advised by your surgeon because they can cause removal of the dermabond glue on your incisions.    8. You may experience vaginal spotting after surgery or around the 6-8 week mark from surgery when the stitches at the top of the vagina begin to dissolve.  The spotting is normal but if you experience heavy bleeding, call our office.  9. Take Tylenol or ibuprofen first for pain and only use narcotic pain medication for severe pain not relieved by the Tylenol or Ibuprofen.  Monitor your Tylenol intake to a max of 4,000 mg in a 24 hour period. You can alternate these medications after surgery.  Diet: 1. Low sodium Heart Healthy Diet is recommended but you are cleared to resume your normal (before surgery) diet after your procedure.  2. It is safe to use a laxative, such as  Miralax or Colace, if you have difficulty moving your bowels. You have been prescribed Sennakot-S to take at bedtime every evening after surgery to keep bowel movements regular and to prevent constipation.    Wound Care: 1. Keep clean and dry.  Shower daily.  Reasons to call the Doctor: Fever - Oral  temperature greater than 100.4 degrees Fahrenheit Foul-smelling vaginal discharge Difficulty urinating Nausea and vomiting Increased pain at the site of the incision that is unrelieved with pain medicine. Difficulty breathing with or without chest pain New calf pain especially if only on one side Sudden, continuing increased vaginal bleeding with or without clots.   Contacts: For questions or concerns you should contact:  Dr. Jeral Pinch at [phone]  Joylene John, NP at [phone]  After Hours: call [phone] and have the GYN Oncologist paged/contacted (after 5 pm or on the weekends).  Messages sent via mychart are for non-urgent matters and are not responded to after hours so for urgent needs, please call the after hours number.

## 2021-10-31 ENCOUNTER — Telehealth: Payer: Self-pay | Admitting: Gynecologic Oncology

## 2021-10-31 ENCOUNTER — Encounter: Payer: 59 | Admitting: Obstetrics and Gynecology

## 2021-10-31 NOTE — Telephone Encounter (Signed)
Received requested callback from Dr. Talbert Nan.  Reviewed findings from the time of surgery which included quite thick endometrium including what was suspected to be either a fibroid or polyp.  Endocervical polyp was benign in appearance.  Surgical findings as well as pathology strongly favor an endometrial primary.  Jeral Pinch MD Gynecologic Oncology

## 2021-11-03 ENCOUNTER — Telehealth: Payer: Self-pay | Admitting: Family Medicine

## 2021-11-03 LAB — SURGICAL PATHOLOGY

## 2021-11-03 NOTE — Telephone Encounter (Signed)
I haven't seen her for her diabetes in over a year.  At this time, just stop the Metformin and we'll see where things are when she comes in for her appt

## 2021-11-03 NOTE — Telephone Encounter (Signed)
Pt called in asking if the Jardance can be sent in for her, she has an appt on 11/07/21

## 2021-11-03 NOTE — Telephone Encounter (Signed)
Spoke w/ pt and advised her to stop the Metformin and we will see her Friday at her visit

## 2021-11-04 ENCOUNTER — Other Ambulatory Visit: Payer: Self-pay | Admitting: Gynecologic Oncology

## 2021-11-04 ENCOUNTER — Telehealth: Payer: Self-pay | Admitting: Gynecologic Oncology

## 2021-11-04 ENCOUNTER — Telehealth: Payer: Self-pay | Admitting: *Deleted

## 2021-11-04 DIAGNOSIS — C541 Malignant neoplasm of endometrium: Secondary | ICD-10-CM

## 2021-11-04 DIAGNOSIS — C801 Malignant (primary) neoplasm, unspecified: Secondary | ICD-10-CM

## 2021-11-04 MED ORDER — MEGESTROL ACETATE 40 MG PO TABS: 80.0000 mg | ORAL_TABLET | Freq: Two times a day (BID) | ORAL | 3 refills | Status: DC

## 2021-11-04 NOTE — Telephone Encounter (Signed)
Called patient to discuss pathology from [place]. Still favors endometrial (based on repeat review of D&C, neutrophil infiltration), but endocervical origin is not ruled out. Reviewed this all with Dr. Saralyn Pilar prior to calling the patient. Offered patient several options including proceeding with CKC and then, hopefully hysterectomy, if pathology confirms endometrial; versus starting with MRI to assess cervix (stress that this doesn't guarantee no need for CKC). Patient would like to proceed with CKC. I suggested we start her on treatment dosing oral progesterone in the meantime. We discussed side effects which include but are not limited to GI symptoms, VTE, edema, and increased appetite.   Jeral Pinch MD Gynecologic Oncology

## 2021-11-04 NOTE — Progress Notes (Signed)
Pre-op gabapentin ordered 

## 2021-11-04 NOTE — Telephone Encounter (Signed)
Spoke with pt today to offer her the OR date of June 13 for the conization of the cervix and then waiting 8 weeks and scheduling the hysterectomy on January 06, 2022. Pt asked why she needs to do a conization and then turn around and do a hysterectomy. Per Joylene John, NP a conization of the cervix helps [place] get a more informed and accurate diagnosis of whether this is cervical vs. Endometrial cancer. Based upon the information from the conization we can create the best treatment plan for the patient. In the mean time Dr.Tucker will prescribe progesterone. Progesterone is a hormone which can increase the risk of blood clots. Signs and symptoms of blood clots are shortness of breath, bilateral or unilateral leg swelling, pain in the lower extremity, redness or increased warmth in the lower extremities. Informed her that since she works from home and sits a lot she can take little breaks and get up and move around to increase circulation and help prevent blood clots. Pt verbalized understanding. Joylene John, NP notified.

## 2021-11-05 ENCOUNTER — Other Ambulatory Visit: Payer: Self-pay | Admitting: Gynecologic Oncology

## 2021-11-05 ENCOUNTER — Telehealth: Payer: Self-pay | Admitting: *Deleted

## 2021-11-05 ENCOUNTER — Encounter: Payer: Self-pay | Admitting: Gynecologic Oncology

## 2021-11-05 DIAGNOSIS — C801 Malignant (primary) neoplasm, unspecified: Secondary | ICD-10-CM

## 2021-11-05 MED ORDER — IBUPROFEN 800 MG PO TABS: 800.0000 mg | ORAL_TABLET | Freq: Three times a day (TID) | ORAL | 0 refills | Status: AC | PRN

## 2021-11-05 MED ORDER — TRAMADOL HCL 50 MG PO TABS: 50.0000 mg | ORAL_TABLET | Freq: Four times a day (QID) | ORAL | 0 refills | Status: DC | PRN

## 2021-11-05 MED ORDER — SENNOSIDES-DOCUSATE SODIUM 8.6-50 MG PO TABS: 2.0000 | ORAL_TABLET | Freq: Every day | ORAL | 0 refills | Status: DC

## 2021-11-05 NOTE — Telephone Encounter (Signed)
Spoke with pre op short stay and asked if they could reach back out to pt regarding instructions on her upcoming procedure. They stated they will check with the nurse and give pt a call back.

## 2021-11-05 NOTE — Progress Notes (Signed)
Meds for post-op s/p cone sent in preop.

## 2021-11-05 NOTE — Telephone Encounter (Signed)
Spoke with pt today to make sure she was able to see the directions sent to her my chart from Joylene John, NP. Pt stated that she did receive it. Also informed her per Joylene John, NP pt can pick up her medications for post op now so she'll have it ready when after the procedure. She stated she will pick it up. She stated she has started her menstrual cycle and asked if this will affect the surgery. Per Joylene John, NP it will not affect it and pt can go ahead and take the progesterone as well. Pt stated that pre op called her regarding her surgery in August and that she also needs labs on Monday before her procedure on Tuesday but did not give any further instructions. Informed her that I would reach out to pre op and clarify and ask them to call her back. She verbalized understanding.

## 2021-11-06 ENCOUNTER — Inpatient Hospital Stay: Payer: 59 | Admitting: Gynecologic Oncology

## 2021-11-06 NOTE — Patient Instructions (Signed)
DUE TO COVID-19 ONLY TWO VISITORS  (aged 46 and older)  ARE ALLOWED TO COME WITH YOU AND STAY IN THE WAITING ROOM ONLY DURING PRE OP AND PROCEDURE.   **NO VISITORS ARE ALLOWED IN THE SHORT STAY AREA OR RECOVERY ROOM!!**  IF YOU WILL BE ADMITTED INTO THE HOSPITAL YOU ARE ALLOWED ONLY FOUR SUPPORT PEOPLE DURING VISITATION HOURS ONLY (7 AM -8PM)   The support person(s) must pass our screening, gel in and out, and wear a mask at all times, including in the patient's room. Patients must also wear a mask when staff or their support person are in the room. Visitors GUEST BADGE MUST BE WORN VISIBLY  One adult visitor may remain with you overnight and MUST be in the room by 8 P.M.     Your procedure is scheduled on: 11/11/21   Report to [facility] Main Entrance    Report to admitting at : 11:00 AM   Call this number if you have problems the morning of surgery [phone]   Do not eat food :After Midnight.   After Midnight you may have the following liquids until: 10:00 AM DAY OF SURGERY  Water Black Coffee (sugar ok, NO MILK/CREAM OR CREAMERS)  Tea (sugar ok, NO MILK/CREAM OR CREAMERS) regular and decaf                             Plain Jell-O (NO RED)                                           Fruit ices (not with fruit pulp, NO RED)                                     Popsicles (NO RED)                                                                  Juice: apple, WHITE grape, WHITE cranberry Sports drinks like Gatorade (NO RED) Clear broth(vegetable,chicken,beef)  Drink G2 drink AT :10:00 AM the day of surgery.       The day of surgery:  Drink ONE (1) Pre-Surgery Clear Ensure or G2 at AM the morning of surgery. Drink in one sitting. Do not sip.  This drink was given to you during your hospital  pre-op appointment visit. Nothing else to drink after completing the  Pre-Surgery Clear Ensure or G2.          If you have questions, please contact your surgeon's office.   Oral  Hygiene is also important to reduce your risk of infection.                                    Remember - BRUSH YOUR TEETH THE MORNING OF SURGERY WITH YOUR REGULAR TOOTHPASTE   Do NOT smoke after Midnight   Take these medicines the morning of surgery with A SIP OF WATER: N/A  DO NOT TAKE ANY ORAL DIABETIC  MEDICATIONS DAY OF YOUR SURGERY  Bring CPAP mask and tubing day of surgery.                              You may not have any metal on your body including hair pins, jewelry, and body piercing             Do not wear make-up, lotions, powders, perfumes/cologne, or deodorant  Do not wear nail polish including gel and S&S, artificial/acrylic nails, or any other type of covering on natural nails including finger and toenails. If you have artificial nails, gel coating, etc. that needs to be removed by a nail salon please have this removed prior to surgery or surgery may need to be canceled/ delayed if the surgeon/ anesthesia feels like they are unable to be safely monitored.   Do not shave  48 hours prior to surgery.   Do not bring valuables to the hospital. [place].   Contacts, dentures or bridgework may not be worn into surgery.   Bring small overnight bag day of surgery.   DO NOT [place]. PHARMACY WILL DISPENSE MEDICATIONS LISTED ON YOUR MEDICATION LIST TO YOU DURING YOUR ADMISSION [place]!    Patients discharged on the day of surgery will not be allowed to drive home.  Someone NEEDS to stay with you for the first 24 hours after anesthesia.   Special Instructions: Bring a copy of your healthcare power of attorney and living will documents         the day of surgery if you haven't scanned them before.              Please read over the following fact sheets you were given: IF YOU HAVE QUESTIONS ABOUT YOUR PRE-OP INSTRUCTIONS PLEASE CALL [phone]     [facility] - Preparing for  Surgery Before surgery, you can play an important role.  Because skin is not sterile, your skin needs to be as free of germs as possible.  You can reduce the number of germs on your skin by washing with CHG (chlorahexidine gluconate) soap before surgery.  CHG is an antiseptic cleaner which kills germs and bonds with the skin to continue killing germs even after washing. Please DO NOT use if you have an allergy to CHG or antibacterial soaps.  If your skin becomes reddened/irritated stop using the CHG and inform your nurse when you arrive at Short Stay. Do not shave (including legs and underarms) for at least 48 hours prior to the first CHG shower.  You may shave your face/neck. Please follow these instructions carefully:  1.  Shower with CHG Soap the night before surgery and the  morning of Surgery.  2.  If you choose to wash your hair, wash your hair first as usual with your  normal  shampoo.  3.  After you shampoo, rinse your hair and body thoroughly to remove the  shampoo.                           4.  Use CHG as you would any other liquid soap.  You can apply chg directly  to the skin and wash  Gently with a scrungie or clean washcloth.  5.  Apply the CHG Soap to your body ONLY FROM THE NECK DOWN.   Do not use on face/ open                           Wound or open sores. Avoid contact with eyes, ears mouth and genitals (private parts).                       Wash face,  Genitals (private parts) with your normal soap.             6.  Wash thoroughly, paying special attention to the area where your surgery  will be performed.  7.  Thoroughly rinse your body with warm water from the neck down.  8.  DO NOT shower/wash with your normal soap after using and rinsing off  the CHG Soap.                9.  Pat yourself dry with a clean towel.            10.  Wear clean pajamas.            11.  Place clean sheets on your bed the night of your first shower and do not  sleep with pets. Day  of Surgery : Do not apply any lotions/deodorants the morning of surgery.  Please wear clean clothes to the hospital/surgery center.  FAILURE TO FOLLOW THESE INSTRUCTIONS MAY RESULT IN THE CANCELLATION OF YOUR SURGERY PATIENT SIGNATURE_________________________________  NURSE SIGNATURE__________________________________  ________________________________________________________________________

## 2021-11-07 ENCOUNTER — Other Ambulatory Visit: Payer: Self-pay | Admitting: Family Medicine

## 2021-11-07 ENCOUNTER — Ambulatory Visit (INDEPENDENT_AMBULATORY_CARE_PROVIDER_SITE_OTHER): Payer: 59 | Admitting: Family Medicine

## 2021-11-07 ENCOUNTER — Encounter: Payer: Self-pay | Admitting: Family Medicine

## 2021-11-07 ENCOUNTER — Telehealth: Payer: Self-pay | Admitting: Gynecologic Oncology

## 2021-11-07 VITALS — BP 130/82 | HR 79 | Temp 98.7°F | Resp 16 | Ht 66.0 in | Wt 230.0 lb

## 2021-11-07 DIAGNOSIS — E119 Type 2 diabetes mellitus without complications: Secondary | ICD-10-CM

## 2021-11-07 DIAGNOSIS — C541 Malignant neoplasm of endometrium: Secondary | ICD-10-CM

## 2021-11-07 DIAGNOSIS — F419 Anxiety disorder, unspecified: Secondary | ICD-10-CM | POA: Diagnosis not present

## 2021-11-07 DIAGNOSIS — Z1231 Encounter for screening mammogram for malignant neoplasm of breast: Secondary | ICD-10-CM

## 2021-11-07 MED ORDER — EMPAGLIFLOZIN 10 MG PO TABS: 10.0000 mg | ORAL_TABLET | Freq: Every day | ORAL | 3 refills | Status: DC

## 2021-11-07 MED ORDER — ALPRAZOLAM 0.5 MG PO TABS: 0.5000 mg | ORAL_TABLET | Freq: Two times a day (BID) | ORAL | 1 refills | Status: DC | PRN

## 2021-11-07 MED ORDER — FLUOXETINE HCL 10 MG PO CAPS: 10.0000 mg | ORAL_CAPSULE | Freq: Every day | ORAL | 3 refills | Status: DC

## 2021-11-07 NOTE — Patient Instructions (Signed)
Follow up in 1 month to recheck mood and progress of treatment plan STOP the Metformin START the Jardiance once daily to control your sugars TAKE the Fluoxetine once daily for anxiety/depression USE the Alprazolam as needed for panicked/high stress moments If I hear anything about the procedure before Tuesday I will let you know! Call with any questions or concerns Hang in there!  You're going to FIGHT!

## 2021-11-07 NOTE — Progress Notes (Unsigned)
   Subjective:    Patient ID: Cassie Rich, female    DOB: 11/28/1975, 46 y.o.   MRN: [id number]  HPI DM- A1C was up to 8.5% last month when she saw Dr Talbert Nan (GYN).  She is not able to tolerate Metformin due to diarrhea.    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  Has cold knife conization on Tuesday.  Has TAH scheduled for August.  Pt had pap done April 2022.  Was called in October and told she needed to have a vaginal [place].  Was told to do this in January.  [place] was inconclusive and needed another study.  Never heard results.  Switched to Dr Talbert Nan who was very thorough and efficient.  Pt feels like her dx is 'a year behind'.  Review of Systems For ROS see HPI     Objective:   Physical Exam Vitals reviewed.  Constitutional:      Appearance: Normal appearance. She is obese. She is not ill-appearing.     Comments: Tearful, anxious  HENT:     Head: Normocephalic and atraumatic.  Cardiovascular:     Rate and Rhythm: Normal rate and regular rhythm.     Heart sounds: Normal heart sounds.  Pulmonary:     Effort: Pulmonary effort is normal. No respiratory distress.     Breath sounds: Normal breath sounds. No wheezing.  Musculoskeletal:     Cervical back: Normal range of motion and neck supple.  Skin:    General: Skin is warm and dry.  Neurological:     General: No focal deficit present.     Mental Status: She is alert and oriented to person, place, and time.  Psychiatric:     Comments: Tearful, anxious           Assessment & Plan:

## 2021-11-07 NOTE — Telephone Encounter (Signed)
Called the patient to help clarify our plan moving forward after receiving a message from her primary care provider.  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  There are a couple of components of her history as well as her to endocervical samplings that raise the concern for possible endocervical carcinoma.  Specifically, on the ECC that I perform, there were some features of the biopsy that were more characteristic of an endocervical adenocarcinoma.  While there are rare times when we cannot distinguish between the 2, a cold knife biopsy, which is the procedure that we have her set up for next week, will hopefully help [place] establish whether this is endocervical adenocarcinoma or what we presume (endometrial carcinoma).  In the meantime, because of the 6-week delay after the cone procedure until her biopsy, I offered the patient that she could be on progesterone for treatment of what is very likely endometrial cancer.  She picked this prescription up earlier this week and is tolerating it well.  We discussed again that in women who are desiring fertility preservation or in women who are too unhealthy for surgery, progesterone therapy is often the tool that we use for treatment of endometrial cancer.  Finally, we discussed the reason for delay of hysterectomy after her cold knife cone biopsy.  There are data to support increased surgical morbidity if hysterectomy is done immediately following conization.  This risk decreases if adequate time for inflammation to subside has been given.  She would like to proceed as we have scheduled with the conization next week and plan for hysterectomy 6 weeks after.  All of the patient's questions were answered.  She voiced appreciation of the phone call to help with clarification of her treatment plan.  Jeral Pinch MD Gynecologic Oncology

## 2021-11-09 DIAGNOSIS — C541 Malignant neoplasm of endometrium: Secondary | ICD-10-CM | POA: Insufficient documentation

## 2021-11-09 NOTE — Assessment & Plan Note (Signed)
Pt's BMI is 37.12 and coupled w/ her DM and hyperlipidemia this qualifies as morbidly obese.  Encouraged low carb diet and healthy lifestyle w/ upcoming cancer fight.  Will follow.

## 2021-11-09 NOTE — Assessment & Plan Note (Signed)
New.  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  She is scheduled for a cold knife conization on Tuesday to further delineate whether this is adenocarcinoma or cervical cancer.  After that, she will require multiple weeks to heal prior to TAH-BSO.  Pt is very scared and overwhelmed b/c she feels that there have already been multiple delays in the process and she wants everything to proceed as quicly as possible.  I will reach out to Dr Berline Lopes to better understand the need for a conization prior to the TAH-BSO b/c the pt would rather just have everything removed ASAP.  I will try and make sure she has her questions answered prior to procedure on Tuesday.

## 2021-11-09 NOTE — Assessment & Plan Note (Signed)
Chronic problem.  A1C was up to 8.5% last month at GYN.  She was not able to tolerate the Metformin that was started due to diarrhea.  Will switch to Jardiance.  Discussed mechanism of action and possibility of yeast infections.  Encouraged low carb diet.  Will follow closely.

## 2021-11-09 NOTE — Assessment & Plan Note (Signed)
Deteriorated with recent cancer dx.  She is understandably anxious and feels completely overwhelmed.  Will start once daily Fluoxetine '10mg'$  daily and bridge w/ Alprazolam 0.'5mg'$  PRN until SSRI can take effect.  Pt expressed understanding and is in agreement w/ plan.

## 2021-11-10 ENCOUNTER — Encounter (HOSPITAL_COMMUNITY)
Admission: RE | Admit: 2021-11-10 | Discharge: 2021-11-10 | Disposition: A | Discharge status: Home / Self Care | Payer: 59 | Source: Ambulatory Visit | Attending: Gynecologic Oncology | Admitting: Gynecologic Oncology

## 2021-11-10 ENCOUNTER — Encounter (HOSPITAL_COMMUNITY): Payer: Self-pay

## 2021-11-10 ENCOUNTER — Other Ambulatory Visit: Payer: Self-pay

## 2021-11-10 ENCOUNTER — Telehealth: Payer: Self-pay | Admitting: *Deleted

## 2021-11-10 VITALS — BP 149/95 | HR 63 | Temp 98.6°F | Ht 66.0 in | Wt 228.0 lb

## 2021-11-10 DIAGNOSIS — E119 Type 2 diabetes mellitus without complications: Secondary | ICD-10-CM | POA: Insufficient documentation

## 2021-11-10 DIAGNOSIS — Z01812 Encounter for preprocedural laboratory examination: Secondary | ICD-10-CM | POA: Diagnosis present

## 2021-11-10 DIAGNOSIS — C801 Malignant (primary) neoplasm, unspecified: Secondary | ICD-10-CM | POA: Insufficient documentation

## 2021-11-10 HISTORY — DX: Peripheral vascular disease, unspecified: I73.9

## 2021-11-10 HISTORY — DX: Malignant (primary) neoplasm, unspecified: C80.1

## 2021-11-10 HISTORY — DX: Anxiety disorder, unspecified: F41.9

## 2021-11-10 LAB — CBC
HCT: 40.7 % (ref 36.0–46.0)
Hemoglobin: 13.2 g/dL (ref 12.0–15.0)
MCH: 25 pg — ABNORMAL LOW (ref 26.0–34.0)
MCHC: 32.4 g/dL (ref 30.0–36.0)
MCV: 77.2 fL — ABNORMAL LOW (ref 80.0–100.0)
Platelets: 421 10*3/uL — ABNORMAL HIGH (ref 150–400)
RBC: 5.27 MIL/uL — ABNORMAL HIGH (ref 3.87–5.11)
RDW: 14.9 % (ref 11.5–15.5)
WBC: 11.2 10*3/uL — ABNORMAL HIGH (ref 4.0–10.5)
nRBC: 0 % (ref 0.0–0.2)

## 2021-11-10 LAB — GLUCOSE, CAPILLARY: Glucose-Capillary: 135 mg/dL — ABNORMAL HIGH (ref 70–99)

## 2021-11-10 LAB — BASIC METABOLIC PANEL
Anion gap: 7 (ref 5–15)
BUN: 9 mg/dL (ref 6–20)
CO2: 21 mmol/L — ABNORMAL LOW (ref 22–32)
Calcium: 9.4 mg/dL (ref 8.9–10.3)
Chloride: 110 mmol/L (ref 98–111)
Creatinine, Ser: 0.75 mg/dL (ref 0.44–1.00)
GFR, Estimated: >60 mL/min (ref >60)
Glucose, Bld: 122 mg/dL — ABNORMAL HIGH (ref 70–99)
Potassium: 3.9 mmol/L (ref 3.5–5.1)
Sodium: 138 mmol/L (ref 135–145)

## 2021-11-10 NOTE — Progress Notes (Addendum)
For Short Stay: [facility] appointment date: Date of COVID positive in last 65 days:  Bowel Prep reminder:   For Anesthesia: PCP - Dr. Midge Minium Cardiologist -   Chest x-ray -  EKG - 10/20/21 Stress Test -  ECHO -  Cardiac Cath -  Pacemaker/ICD device last checked: Pacemaker orders received: Device Rep notified:  Spinal Cord Stimulator:  Sleep Study -  CPAP -   Fasting Blood Sugar - 140's - 170's Checks Blood Sugar __1___ times a day Date and result of last Hgb A1c-8.5 : 09/29/21  Blood Thinner Instructions: Aspirin Instructions: Last Dose:  Activity level: Can go up a flight of stairs and activities of daily living without stopping and without chest pain and/or shortness of breath   Able to exercise without chest pain and/or shortness of breath   Unable to go up a flight of stairs without chest pain and/or shortness of breath     Anesthesia review: Hx: DIA,DVT. Pt. Started jardiance today(11/10/21). Janett Billow PA was informed.  Patient denies shortness of breath, fever, cough and chest pain at PAT appointment   Patient verbalized understanding of instructions that were given to them at the PAT appointment. Patient was also instructed that they will need to review over the PAT instructions again at home before surgery.

## 2021-11-10 NOTE — Telephone Encounter (Signed)
Telephone call to check on pre-operative status.  Patient compliant with pre-operative instructions.  Reinforced nothing to eat after midnight. Clear liquids until 10. Patient to arrive at 39.  No questions or concerns voiced.  Instructed to call for any needs. Also informed pt that per Joylene John, NP pt's WBC is 11.2. Pt denies any signs or symptoms of infection, cough, diarrhea, fevers, chills, or dysuria. she stated she has a tooth on the upper left side that she needs to get pulled. She unable to get it pulled until September but it is not causing her any pain and not bother her at all. Informed pt to call us with any needs. She verbalized understanding.

## 2021-11-11 ENCOUNTER — Other Ambulatory Visit: Payer: Self-pay

## 2021-11-11 ENCOUNTER — Ambulatory Visit (HOSPITAL_BASED_OUTPATIENT_CLINIC_OR_DEPARTMENT_OTHER): Payer: 59 | Admitting: Anesthesiology

## 2021-11-11 ENCOUNTER — Ambulatory Visit (HOSPITAL_COMMUNITY)
Admission: RE | Admit: 2021-11-11 | Discharge: 2021-11-11 | Disposition: A | Discharge status: Home / Self Care | Payer: 59 | Attending: Gynecologic Oncology | Admitting: Gynecologic Oncology

## 2021-11-11 ENCOUNTER — Ambulatory Visit (HOSPITAL_COMMUNITY): Payer: 59 | Admitting: Anesthesiology

## 2021-11-11 ENCOUNTER — Encounter (HOSPITAL_COMMUNITY)
Admission: RE | Disposition: A | Discharge status: Home / Self Care | Payer: Self-pay | Source: Home / Self Care | Attending: Gynecologic Oncology

## 2021-11-11 ENCOUNTER — Encounter (HOSPITAL_COMMUNITY): Payer: Self-pay | Admitting: Gynecologic Oncology

## 2021-11-11 DIAGNOSIS — E119 Type 2 diabetes mellitus without complications: Secondary | ICD-10-CM | POA: Diagnosis not present

## 2021-11-11 DIAGNOSIS — C801 Malignant (primary) neoplasm, unspecified: Secondary | ICD-10-CM

## 2021-11-11 DIAGNOSIS — C530 Malignant neoplasm of endocervix: Secondary | ICD-10-CM | POA: Insufficient documentation

## 2021-11-11 DIAGNOSIS — Z7984 Long term (current) use of oral hypoglycemic drugs: Secondary | ICD-10-CM | POA: Insufficient documentation

## 2021-11-11 DIAGNOSIS — F419 Anxiety disorder, unspecified: Secondary | ICD-10-CM | POA: Diagnosis not present

## 2021-11-11 DIAGNOSIS — I82409 Acute embolism and thrombosis of unspecified deep veins of unspecified lower extremity: Secondary | ICD-10-CM

## 2021-11-11 DIAGNOSIS — Z6837 Body mass index (BMI) 37.0-37.9, adult: Secondary | ICD-10-CM | POA: Insufficient documentation

## 2021-11-11 DIAGNOSIS — Z86718 Personal history of other venous thrombosis and embolism: Secondary | ICD-10-CM | POA: Diagnosis not present

## 2021-11-11 DIAGNOSIS — R002 Palpitations: Secondary | ICD-10-CM | POA: Diagnosis not present

## 2021-11-11 DIAGNOSIS — C539 Malignant neoplasm of cervix uteri, unspecified: Secondary | ICD-10-CM | POA: Diagnosis not present

## 2021-11-11 HISTORY — PX: CERVICAL CONIZATION W/BX: SHX1330

## 2021-11-11 LAB — POCT PREGNANCY, URINE: Preg Test, Ur: NEGATIVE

## 2021-11-11 LAB — GLUCOSE, CAPILLARY: Glucose-Capillary: 138 mg/dL — ABNORMAL HIGH (ref 70–99)

## 2021-11-11 SURGERY — CONE BIOPSY, CERVIX
Anesthesia: General

## 2021-11-11 MED ORDER — FERRIC SUBSULFATE 259 MG/GM EX SOLN
CUTANEOUS | Status: AC
  Filled 2021-11-11: qty 8

## 2021-11-11 MED ORDER — MIDAZOLAM HCL 5 MG/5ML IJ SOLN
INTRAMUSCULAR | Status: DC | PRN
  Administered 2021-11-11: 2 mg via INTRAVENOUS

## 2021-11-11 MED ORDER — ORAL CARE MOUTH RINSE: 15.0000 mL | Freq: Once | OROMUCOSAL | Status: AC

## 2021-11-11 MED ORDER — SILVER NITRATE-POT NITRATE 75-25 % EX MISC
CUTANEOUS | Status: AC
  Filled 2021-11-11: qty 10

## 2021-11-11 MED ORDER — LACTATED RINGERS IV SOLN: INTRAVENOUS | Status: DC

## 2021-11-11 MED ORDER — 0.9 % SODIUM CHLORIDE (POUR BTL) OPTIME: TOPICAL | Status: DC | PRN

## 2021-11-11 MED ORDER — DEXMEDETOMIDINE (PRECEDEX) IN NS 20 MCG/5ML (4 MCG/ML) IV SYRINGE
PREFILLED_SYRINGE | INTRAVENOUS | Status: DC | PRN
  Administered 2021-11-11: 8 ug via INTRAVENOUS

## 2021-11-11 MED ORDER — FENTANYL CITRATE PF 50 MCG/ML IJ SOSY: 25.0000 ug | PREFILLED_SYRINGE | INTRAMUSCULAR | Status: DC | PRN

## 2021-11-11 MED ORDER — LIDOCAINE HCL (CARDIAC) PF 100 MG/5ML IV SOSY
PREFILLED_SYRINGE | INTRAVENOUS | Status: DC | PRN
  Administered 2021-11-11: 100 mg via INTRAVENOUS

## 2021-11-11 MED ORDER — DEXAMETHASONE SODIUM PHOSPHATE 4 MG/ML IJ SOLN: 4.0000 mg | INTRAMUSCULAR | Status: DC

## 2021-11-11 MED ORDER — CELECOXIB 200 MG PO CAPS
200.0000 mg | ORAL_CAPSULE | ORAL | Status: AC
  Administered 2021-11-11: 200 mg via ORAL
  Filled 2021-11-11: qty 1

## 2021-11-11 MED ORDER — ACETIC ACID 5 % SOLN
Status: AC
  Filled 2021-11-11: qty 50

## 2021-11-11 MED ORDER — FENTANYL CITRATE (PF) 100 MCG/2ML IJ SOLN
INTRAMUSCULAR | Status: AC
  Filled 2021-11-11: qty 2

## 2021-11-11 MED ORDER — KETOROLAC TROMETHAMINE 30 MG/ML IJ SOLN
INTRAMUSCULAR | Status: DC | PRN
  Administered 2021-11-11: 30 mg via INTRAVENOUS

## 2021-11-11 MED ORDER — BUPIVACAINE HCL (PF) 0.25 % IJ SOLN
INTRAMUSCULAR | Status: AC
  Filled 2021-11-11: qty 30

## 2021-11-11 MED ORDER — MIDAZOLAM HCL 2 MG/2ML IJ SOLN
INTRAMUSCULAR | Status: AC
  Filled 2021-11-11: qty 2

## 2021-11-11 MED ORDER — SCOPOLAMINE 1 MG/3DAYS TD PT72
1.0000 | MEDICATED_PATCH | TRANSDERMAL | Status: DC
  Administered 2021-11-11: 1.5 mg via TRANSDERMAL
  Filled 2021-11-11: qty 1

## 2021-11-11 MED ORDER — OXYCODONE HCL 5 MG PO TABS: 5.0000 mg | ORAL_TABLET | Freq: Once | ORAL | Status: DC | PRN

## 2021-11-11 MED ORDER — ACETAMINOPHEN 500 MG PO TABS
1000.0000 mg | ORAL_TABLET | ORAL | Status: AC
  Administered 2021-11-11: 1000 mg via ORAL
  Filled 2021-11-11: qty 2

## 2021-11-11 MED ORDER — ONDANSETRON HCL 4 MG/2ML IJ SOLN
INTRAMUSCULAR | Status: AC
  Filled 2021-11-11: qty 2

## 2021-11-11 MED ORDER — DEXAMETHASONE SODIUM PHOSPHATE 10 MG/ML IJ SOLN
INTRAMUSCULAR | Status: AC
  Filled 2021-11-11: qty 1

## 2021-11-11 MED ORDER — STERILE WATER FOR IRRIGATION IR SOLN
Status: DC | PRN
  Administered 2021-11-11: 1000 mL

## 2021-11-11 MED ORDER — PROPOFOL 10 MG/ML IV BOLUS
INTRAVENOUS | Status: DC | PRN
  Administered 2021-11-11: 200 mg via INTRAVENOUS

## 2021-11-11 MED ORDER — BUPIVACAINE HCL 0.25 % IJ SOLN
INTRAMUSCULAR | Status: DC | PRN
  Administered 2021-11-11: 30 mL

## 2021-11-11 MED ORDER — FENTANYL CITRATE (PF) 100 MCG/2ML IJ SOLN
INTRAMUSCULAR | Status: DC | PRN
  Administered 2021-11-11 (×2): 50 ug via INTRAVENOUS

## 2021-11-11 MED ORDER — PROPOFOL 10 MG/ML IV BOLUS
INTRAVENOUS | Status: AC
  Filled 2021-11-11: qty 20

## 2021-11-11 MED ORDER — GABAPENTIN 300 MG PO CAPS
300.0000 mg | ORAL_CAPSULE | ORAL | Status: AC
  Administered 2021-11-11: 300 mg via ORAL
  Filled 2021-11-11: qty 1

## 2021-11-11 MED ORDER — LIDOCAINE HCL (PF) 2 % IJ SOLN
INTRAMUSCULAR | Status: AC
  Filled 2021-11-11: qty 5

## 2021-11-11 MED ORDER — ONDANSETRON HCL 4 MG/2ML IJ SOLN: 4.0000 mg | Freq: Once | INTRAMUSCULAR | Status: DC | PRN

## 2021-11-11 MED ORDER — DEXMEDETOMIDINE (PRECEDEX) IN NS 20 MCG/5ML (4 MCG/ML) IV SYRINGE
PREFILLED_SYRINGE | INTRAVENOUS | Status: AC
  Filled 2021-11-11: qty 5

## 2021-11-11 MED ORDER — OXYCODONE HCL 5 MG/5ML PO SOLN: 5.0000 mg | Freq: Once | ORAL | Status: DC | PRN

## 2021-11-11 MED ORDER — CHLORHEXIDINE GLUCONATE 0.12 % MT SOLN
15.0000 mL | Freq: Once | OROMUCOSAL | Status: AC
  Administered 2021-11-11: 15 mL via OROMUCOSAL

## 2021-11-11 MED ORDER — HEMOSTATIC AGENTS (NO CHARGE) OPTIME
TOPICAL | Status: DC | PRN
  Administered 2021-11-11: 1 via TOPICAL

## 2021-11-11 MED ORDER — DEXAMETHASONE SODIUM PHOSPHATE 10 MG/ML IJ SOLN
INTRAMUSCULAR | Status: DC | PRN
  Administered 2021-11-11: 8 mg via INTRAVENOUS

## 2021-11-11 MED ORDER — ONDANSETRON HCL 4 MG/2ML IJ SOLN
INTRAMUSCULAR | Status: DC | PRN
  Administered 2021-11-11: 4 mg via INTRAVENOUS

## 2021-11-11 SURGICAL SUPPLY — 33 items
BAG COUNTER SPONGE SURGICOUNT (BAG) IMPLANT
BLADE SURG SZ11 CARB STEEL (BLADE) ×2 IMPLANT
CATH ROBINSON RED A/P 16FR (CATHETERS) ×2 IMPLANT
COVER SURGICAL LIGHT HANDLE (MISCELLANEOUS) ×2 IMPLANT
DRAPE SHEET LG 3/4 BI-LAMINATE (DRAPES) ×4 IMPLANT
DRSG TELFA 3X8 NADH (GAUZE/BANDAGES/DRESSINGS) IMPLANT
ELECT LLETZ BALL 5MM DISP (ELECTRODE) IMPLANT
ELECT PENCIL ROCKER SW 15FT (MISCELLANEOUS) ×1 IMPLANT
ELECT REM PT RETURN 15FT ADLT (MISCELLANEOUS) ×2 IMPLANT
GLOVE BIO SURGEON STRL SZ 6 (GLOVE) ×4 IMPLANT
GOWN STRL REUS W/ TWL LRG LVL3 (GOWN DISPOSABLE) ×1 IMPLANT
GOWN STRL REUS W/TWL LRG LVL3 (GOWN DISPOSABLE) ×2
HEMOSTAT SURGICEL 4X8 (HEMOSTASIS) ×1 IMPLANT
KIT BASIN OR (CUSTOM PROCEDURE TRAY) ×2 IMPLANT
KIT TURNOVER KIT A (KITS) IMPLANT
NDL SPNL 22GX3.5 QUINCKE BK (NEEDLE) ×1 IMPLANT
NEEDLE SPNL 22GX3.5 QUINCKE BK (NEEDLE) ×2 IMPLANT
PACK VAGINAL WOMENS (CUSTOM PROCEDURE TRAY) ×2 IMPLANT
PAD DRESSING TELFA 3X8 NADH (GAUZE/BANDAGES/DRESSINGS) IMPLANT
SCOPETTES 8  STERILE (MISCELLANEOUS)
SCOPETTES 8 STERILE (MISCELLANEOUS) IMPLANT
SOL PREP POV-IOD 4OZ 10% (MISCELLANEOUS) ×2 IMPLANT
SPONGE SURGIFOAM ABS GEL 12-7 (HEMOSTASIS) IMPLANT
SUT VIC AB 0 CT1 27 (SUTURE) ×4
SUT VIC AB 0 CT1 27XBRD ANTBC (SUTURE) IMPLANT
SUT VIC AB 2-0 SH 27 (SUTURE) ×2
SUT VIC AB 2-0 SH 27X BRD (SUTURE) IMPLANT
SUT VIC AB 2-0 UR5 27 (SUTURE) IMPLANT
SUT VICRYL 0 UR6 27IN ABS (SUTURE) ×1 IMPLANT
SYR CONTROL 10ML LL (SYRINGE) ×2 IMPLANT
TOWEL OR 17X26 10 PK STRL BLUE (TOWEL DISPOSABLE) ×2 IMPLANT
TOWEL OR NON WOVEN STRL DISP B (DISPOSABLE) ×2 IMPLANT
UNDERPAD 30X36 HEAVY ABSORB (UNDERPADS AND DIAPERS) ×2 IMPLANT

## 2021-11-11 NOTE — Interval H&P Note (Signed)
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  The various methods of treatment have been discussed with the patient and family. After consideration of risks, benefits and other options for treatment, the patient has consented to  Procedure(s): CONIZATION CERVIX WITH BIOPSY, ENDOCERVICAL CURETTAGE (N/A) as a surgical intervention.  The patient's history has been reviewed, patient examined, no change in status, stable for surgery.  I have reviewed the patient's chart and labs.  Questions were answered to the patient's satisfaction.     Lafonda Mosses

## 2021-11-11 NOTE — Op Note (Signed)
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 but ECC showing adenocarcinoma with stains unable to distinguish endocervical vs endometrial origin  Postoperative Diagnosis: same as above  Surgery: cold knife conization of cervix, post cone ECC, post cone endocervical biopsies  Surgeons:  Valarie Cones MD  Assistant: none  Anesthesia: General   Estimated blood loss: 50 ml  IVF:  see I&O flowsheet   Urine output: n/a   Complications: None apparent  Pathology: CKC with marking stitch at 12 o'clock, post cone ECC, post-cone anterior and posterior endocervical biopsies  Operative findings: Cervix normal size on exam, mildly firm (not nodular or barrel shaped), no obvious parametrial nodularity or thickening. During CKC, cervix very firm and difficult to cut through with both scalpel and Mayos. Dilated endocervical canal appreciated after CKC performed, additional biopsies taken from anterior and posterior endocervical canal.  Procedure: The patient was identified in the preoperative holding area. Informed consent was signed on the chart. Patient was seen history was reviewed and exam was performed.   The patient was then taken to the operating room and placed in the supine position with SCD hose on. General anesthesia was then induced without difficulty. She was then placed in the dorsolithotomy position. The perineum was prepped with Betadine. The vagina was prepped with Betadine. The patient was then draped after the prep was dried.   Timeout was performed the patient, procedure, antibiotic, allergy, and length of procedure.   The weighted speculum was placed in the posterior vagina. The right angle retractor was placed anteriorally to visualize the cervix. A 0-vicryl suture was used to place stay sutures (which were tagged) at 3 and 9 o'clock of the cervicovaginal junction. 10cc of 1% lidocaine was  infiltrated into 4 and 8 o'clock of the cervicovaginal junction. The uterine sound was placed in the cervix to delineate the course of the endocervical canal. An 11 blade scalpel on a long knife handle was used to make an incision around the face of the cervix, inside of the stay sutures, circumferentially. A single tooth tenaculum grasped the specimen to manipulate it. The incision was then angled towards the endocervix to amputate the specimen. It was removed, oriented with a marking stitch at the 12 o'clock ectocervix and sent to pathology.   A post-cone ECC was collected from the endocervical canal using a kavorkian currette.  Tischler forceps were used to take biopsies from the anterior and posterior aspect of the endocervical canal.  The bovie was used at 40 coag to create hemostasis at the surgical bed. A baseball stitch was placed with 2-0 Vicryl along posterior face of the cone bed to achieve hemostasis posteriorly.  Surgicel was then placed within the cone bed. Excellent hemostasis was noted. The stay sutures were loosely tied together over the face of the cervix to hold the Surgicel in place.  The vagina was irrigated.  All instrument, suture, laparotomy, Ray-Tec, and needle counts were correct x2. The patient tolerated the procedure well and was taken recovery room in stable condition. Linward Natal, MD

## 2021-11-11 NOTE — Anesthesia Procedure Notes (Signed)
Procedure Name: LMA Insertion Date/Time: 11/11/2021 1:08 PM  Performed by: Jonna Munro, CRNAPre-anesthesia Checklist: Patient identified, Emergency Drugs available, Suction available, Patient being monitored and Timeout performed Patient Re-evaluated:Patient Re-evaluated prior to induction Oxygen Delivery Method: Circle system utilized Preoxygenation: Pre-oxygenation with 100% oxygen Induction Type: IV induction LMA: LMA inserted LMA Size: 4.0 Number of attempts: 1 Placement Confirmation: positive ETCO2, breath sounds checked- equal and bilateral and CO2 detector Tube secured with: Tape Dental Injury: Teeth and Oropharynx as per pre-operative assessment

## 2021-11-11 NOTE — Anesthesia Preprocedure Evaluation (Signed)
Anesthesia Evaluation  Patient identified by MRN, date of birth, ID band Patient awake    Reviewed: Allergy & Precautions, NPO status , Patient's Chart, lab work & pertinent test results  Airway Mallampati: II  TM Distance: >3 FB Neck ROM: Full    Dental no notable dental hx.    Pulmonary neg pulmonary ROS,    Pulmonary exam normal breath sounds clear to auscultation       Cardiovascular + DVT  Normal cardiovascular exam Rhythm:Regular Rate:Normal     Neuro/Psych negative neurological ROS  negative psych ROS   GI/Hepatic negative GI ROS, Neg liver ROS,   Endo/Other  diabetes, Type 2Morbid obesity  Renal/GU negative Renal ROS  negative genitourinary   Musculoskeletal negative musculoskeletal ROS (+)   Abdominal   Peds negative pediatric ROS (+)  Hematology negative hematology ROS (+)   Anesthesia Other Findings   Reproductive/Obstetrics negative OB ROS                             Anesthesia Physical Anesthesia Plan  ASA: 3  Anesthesia Plan: General   Post-op Pain Management: Minimal or no pain anticipated   Induction: Intravenous  PONV Risk Score and Plan: 3 and Ondansetron, Dexamethasone, Midazolam and Treatment may vary due to age or medical condition  Airway Management Planned: LMA  Additional Equipment:   Intra-op Plan:   Post-operative Plan: Extubation in OR  Informed Consent: I have reviewed the patients History and Physical, chart, labs and discussed the procedure including the risks, benefits and alternatives for the proposed anesthesia with the patient or authorized representative who has indicated his/her understanding and acceptance.     Dental advisory given  Plan Discussed with: CRNA and Surgeon  Anesthesia Plan Comments:         Anesthesia Quick Evaluation

## 2021-11-11 NOTE — Transfer of Care (Signed)
Immediate Anesthesia Transfer of Care Note  Patient: Cassie Rich  Procedure(s) Performed: CONIZATION CERVIX WITH BIOPSY, ENDOCERVICAL CURETTAGE  Patient Location: PACU  Anesthesia Type:General  Level of Consciousness: awake, alert , oriented and patient cooperative  Airway & Oxygen Therapy: Patient Spontanous Breathing and Patient connected to face mask oxygen  Post-op Assessment: Report given to RN, Post -op Vital signs reviewed and stable and Patient moving all extremities X 4  Post vital signs: Reviewed and stable  Last Vitals:  Vitals Value Taken Time  BP 134/93 11/11/21 1401  Temp    Pulse 84 11/11/21 1402  Resp 27 11/11/21 1402  SpO2 100 % 11/11/21 1402  Vitals shown include unvalidated device data.  Last Pain:  Vitals:   11/11/21 1121  TempSrc:   PainSc: 0-No pain         Complications: No notable events documented.

## 2021-11-11 NOTE — Discharge Instructions (Signed)
AFTER SURGERY INSTRUCTIONS   Return to work:  1-2 days if applicable since working from home   You may notice a piece of gauze like-material come out of the vagina over the next several days. This is NORMAL and is used during surgery to help stop bleeding at the cervix.   Activity: 1. Be up and out of the bed during the day.  Take a nap if needed.  You may walk up steps but be careful and use the hand rail.  Stair climbing will tire you more than you think, you may need to stop part way and rest.    2. No lifting or straining for 2 weeks minimum over 10 pounds. No pushing, pulling, straining for 2 weeks.   3. No driving for minimum 24 hours after surgery.  Do not drive if you are taking narcotic pain medicine and make sure that your reaction time has returned.    4. You can shower as soon as the next day after surgery. Shower daily. No tub baths or submerging your body in water until cleared by your surgeon. If you have the soap that was given to you by pre-surgical testing that was used before surgery, you do not need to use it afterwards because this can irritate your incisions.    5. No sexual activity and nothing in the vagina for 4 weeks, until you are seen in the office.   6. You may experience vaginal spotting and discharge after surgery.  The spotting is normal but if you experience heavy bleeding, call our office.   7. Take Tylenol or ibuprofen first for pain and only use narcotic pain medication for severe pain not relieved by the Tylenol or Ibuprofen.  Monitor your Tylenol intake to a max of 4,000 mg in a 24 hour period. You can alternate these medications after surgery.   Diet: 1. Low sodium Heart Healthy Diet is recommended but you are cleared to resume your normal (before surgery) diet after your procedure.   2. It is safe to use a laxative, such as Miralax or Colace, if you have difficulty moving your bowels. You have been prescribed Sennakot at bedtime every evening to keep  bowel movements regular and to prevent constipation.     Wound Care: 1. Keep clean and dry.  Shower daily.   Reasons to call the Doctor: Fever - Oral temperature greater than 100.4 degrees Fahrenheit Foul-smelling vaginal discharge Difficulty urinating Nausea and vomiting Increased pain at the site of the incision that is unrelieved with pain medicine. Difficulty breathing with or without chest pain New calf pain especially if only on one side Sudden, continuing increased vaginal bleeding with or without clots.   Contacts: For questions or concerns you should contact:   Dr. Jeral Pinch at [phone]   Joylene John, NP at [phone]   After Hours: call [phone] and have the GYN Oncologist paged/contacted (after 5 pm or on the weekends).   Messages sent via mychart are for non-urgent matters and are not responded to after hours so for urgent needs, please call the after hours number.

## 2021-11-12 ENCOUNTER — Encounter (HOSPITAL_COMMUNITY): Payer: Self-pay | Admitting: Gynecologic Oncology

## 2021-11-12 ENCOUNTER — Telehealth: Payer: Self-pay

## 2021-11-12 NOTE — Telephone Encounter (Signed)
Spoke with Cassie Rich this afternoon. She states she is eating, drinking and urinating well. She has not had a BM yet but is passing gas. "If I don't have a BM today I will take something over the counter." She denies fever or chills. She denies any vaginal spotting or bleeding. She resumed taking her megestrol today. She rates her pain 0/10. She states " I have not needed to take tylenol or advil, I feel good."  Instructed to call office with any fever, chills, purulent drainage, uncontrolled pain or any other questions or concerns. Patient verbalizes understanding.   Pt aware of post op appointments as well as the office number [phone] and after hours number [phone] to call if she has any questions or concerns

## 2021-11-12 NOTE — Anesthesia Postprocedure Evaluation (Signed)
Anesthesia Post Note  Patient: Cassie Rich  Procedure(s) Performed: CONIZATION CERVIX WITH BIOPSY, ENDOCERVICAL CURETTAGE     Patient location during evaluation: PACU Anesthesia Type: General Level of consciousness: awake and alert Pain management: pain level controlled Vital Signs Assessment: post-procedure vital signs reviewed and stable Respiratory status: spontaneous breathing, nonlabored ventilation, respiratory function stable and patient connected to nasal cannula oxygen Cardiovascular status: blood pressure returned to baseline and stable Postop Assessment: no apparent nausea or vomiting Anesthetic complications: no   No notable events documented.  Last Vitals:  Vitals:   11/11/21 1400 11/11/21 1430  BP: (!) 134/93 136/85  Pulse: 93 72  Resp: 13 13  Temp:    SpO2: 100% 100%    Last Pain:  Vitals:   11/11/21 1430  TempSrc:   PainSc: 0-No pain                 Elide Stalzer S

## 2021-11-13 ENCOUNTER — Telehealth: Payer: Self-pay | Admitting: Gynecologic Oncology

## 2021-11-13 ENCOUNTER — Telehealth: Payer: Self-pay | Admitting: *Deleted

## 2021-11-13 ENCOUNTER — Other Ambulatory Visit (HOSPITAL_COMMUNITY): Payer: 59

## 2021-11-13 DIAGNOSIS — C801 Malignant (primary) neoplasm, unspecified: Secondary | ICD-10-CM

## 2021-11-13 LAB — SURGICAL PATHOLOGY

## 2021-11-13 NOTE — Telephone Encounter (Signed)
Called patient to discuss results from surgery this week. Cone negative. Post cone ECC and one of biopsies of upper canal of the cervix show adenocarcinoma with stains favoring endocervical origin. I have asked that second review be done of her original D&C pathology. This will hopefully either confirm that all tissue staining the same or, if dissimilar, may raise possibility of concurrent endocervical and endometrial cancers. Although this could represent endometrial cancer with endocervical involvement, stains of more recent biopsies do not favor this possibility.  We will plan to leave surgery scheduled as is. I have ordered a pelvic MRI and PET scan to be performed 3-4 weeks after her cone. Based on these results, we will make a final treatment plan with regard to surgery (and if so, likely radical hysterectomy) versus primary chemoradiation.   Jeral Pinch MD Gynecologic Oncology

## 2021-11-13 NOTE — Telephone Encounter (Signed)
Per Dr Berline Lopes scheduled the patient for a MRI and PET scan the week on 7/10. Called and gave the patient the date/times/instructions for the appts. Patient verbalized understanding.    PET scan 7/10 at 7 am  MRI scan 7/10 at 8 am  Arrive to [facility] at 6:45 am Nothing to eat or drink after MN

## 2021-11-14 LAB — SURGICAL PATHOLOGY

## 2021-11-19 DIAGNOSIS — C541 Malignant neoplasm of endometrium: Secondary | ICD-10-CM

## 2021-11-20 ENCOUNTER — Inpatient Hospital Stay (HOSPITAL_BASED_OUTPATIENT_CLINIC_OR_DEPARTMENT_OTHER): Payer: 59 | Admitting: Gynecologic Oncology

## 2021-11-20 ENCOUNTER — Encounter: Payer: Self-pay | Admitting: Gynecologic Oncology

## 2021-11-20 DIAGNOSIS — E669 Obesity, unspecified: Secondary | ICD-10-CM

## 2021-11-20 DIAGNOSIS — Z7189 Other specified counseling: Secondary | ICD-10-CM

## 2021-11-20 DIAGNOSIS — C801 Malignant (primary) neoplasm, unspecified: Secondary | ICD-10-CM

## 2021-11-20 DIAGNOSIS — Z86718 Personal history of other venous thrombosis and embolism: Secondary | ICD-10-CM

## 2021-11-20 NOTE — Progress Notes (Signed)
Gynecologic Oncology Telehealth Consult Note: Gyn-Onc  I connected with [name] on 11/20/21 at  4:15 PM EDT by telephone and verified that I am speaking with the correct person using two identifiers.  I discussed the limitations, risks, security and privacy concerns of performing an evaluation and management service by telemedicine and the availability of in-person appointments. I also discussed with the patient that there may be a patient responsible charge related to this service. The patient expressed understanding and agreed to proceed.  Other persons participating in the visit and their role in the encounter: none.  Patient's location: home Provider's location: [facility]  Reason for Visit: follow-up  Interval History: Occasional spotting. Denies pain.  Met with Dr. Claiborne Billings at [facility] yesterday.  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/2008   RLE  in setting right ankle fx and long car trip from [place];  completed blood thinner   Hyperlipidemia    Peripheral vascular disease ([place])    Type 2 diabetes mellitus ([place])    followed by pcp---  (10-16-2021 pt given metformin but per pt is trying diet controlled until has A1c checked again,  stated does not check blood sugar at home)   Wears glasses     Past Surgical History:  Procedure Laterality Date   CERVICAL CONIZATION W/BX N/A 11/11/2021   Procedure: CONIZATION CERVIX WITH BIOPSY, ENDOCERVICAL CURETTAGE;  Surgeon: Lafonda Mosses, MD;  Location: [facility];  Service: Gynecology;  Laterality: N/A;   DILATATION & CURETTAGE/HYSTEROSCOPY WITH MYOSURE N/A 10/20/2021   Procedure: DILATATION & CURETTAGE/HYSTEROSCOPY WITH MYOSURE;  Surgeon: Salvadore Dom, MD;  Location: [facility];  Service: Gynecology;  Laterality: N/A;   FLEXIBLE  SIGMOIDOSCOPY  07/2009   _0  by dr Deatra Ina;   w/ external hemorrhoid banding ligation   LAPAROSCOPY WITH TUBAL LIGATION Bilateral 12/02/1999   _1 ;  w/ electrocautery    Family History  Problem Relation Age of Onset   Diabetes Mother    Breast cancer Neg Hx    Ovarian cancer Neg Hx    Endometrial cancer Neg Hx    Pancreatic cancer Neg Hx    Prostate cancer Neg Hx    Colon cancer Neg Hx     Social History   Socioeconomic History   Marital status: Married    Spouse name: Not on file   Number of children: Not on file   Years of education: Not on file   Highest education level: Not on file  Occupational History   Not on file  Tobacco Use   Smoking status: Never   Smokeless tobacco: Never  Vaping Use   Vaping Use: Never used  Substance and Sexual Activity   Alcohol use: Not Currently    Alcohol/week: 4.0 - 6.0 standard drinks of alcohol    Types: 4 - 6 Standard drinks or equivalent per week   Drug use: Never   Sexual activity: Yes    Partners: Male    Birth control/protection: Surgical    Comment: BTL  Other Topics Concern   Not on file  Social History Narrative   Right handed   One story apartment   No caffeine, occ soda   Social Determinants of Health   Financial Resource Strain: Not on file  Food Insecurity: Not on file  Transportation Needs: Not on file  Physical Activity: Not on file  Stress: Not on file  Social Connections: Not on file    Current Medications:  Current Outpatient Medications:    ALPRAZolam (XANAX) 0.5 MG tablet, Take 1 tablet (0.5 mg total) by mouth 2 (two) times daily as needed for anxiety. (Patient not taking: Reported on 11/10/2021), Disp: 30 tablet, Rfl: 1   Cholecalciferol (VITAMIN D3 PO), Take by mouth daily., Disp: , Rfl:    Cyanocobalamin (B-12 PO), Take by mouth daily., Disp: , Rfl:    empagliflozin (JARDIANCE) 10 MG TABS tablet, Take 1 tablet (10 mg total) by mouth daily before breakfast., Disp: 30 tablet, Rfl: 3   FLUoxetine  (PROZAC) 10 MG capsule, Take 1 capsule (10 mg total) by mouth daily. (Patient not taking: Reported on 11/10/2021), Disp: 90 capsule, Rfl: 3   ibuprofen (ADVIL) 800 MG tablet, Take 1 tablet (800 mg total) by mouth every 8 (eight) hours as needed for moderate pain. For AFTER surgery only (Patient not taking: Reported on 11/10/2021), Disp: 30 tablet, Rfl: 0   megestrol (MEGACE) 40 MG tablet, Take 2 tablets (80 mg total) by mouth 2 (two) times daily., Disp: 120 tablet, Rfl: 3   senna-docusate (SENOKOT-S) 8.6-50 MG tablet, Take 2 tablets by mouth at bedtime. For AFTER surgery, do not take if having diarrhea (Patient not taking: Reported on 11/10/2021), Disp: 30 tablet, Rfl: 0   traMADol (ULTRAM) 50 MG tablet, Take 1 tablet (50 mg total) by mouth every 6 (six) hours as needed for severe pain. For AFTER surgery only, do not take and drive (Patient not taking: Reported on 11/10/2021), Disp: 5 tablet, Rfl: 0  Review of Symptoms: Pertinent positives as per HPI.  Physical Exam: There were no vitals taken for this visit. Deferred given limitations of phone visit.  Laboratory & Radiologic Studies: None new  Assessment & Plan: Cassie Rich is a 46 y.o. 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  Patient continues to do well after recent cold knife cone procedure.  We discussed her visit with Dr. Claiborne Billings yesterday.  She notes that this went well.  I reviewed again information to be gained from the PET scan and MRI both in terms of size and extent of local invasion as well as evidence of distant disease.  We discussed surgery if this is a treatment option based on her imaging which would be a radical hysterectomy with bilateral salpingectomy versus bilateral salpingo-oophorectomy and pelvic lymph node dissection.  The patient asked some very good questions about robotic  surgery versus open surgery.  I discussed some of the more recent data which showed or survival in patients who underwent minimally invasive robotic surgery.  I think that the safest surgery from an oncologic standpoint would be open surgery although if, after discussion of the risks and benefits of robotic surgery the patient felt strongly that she wanted to move forward with this type of surgery, this is a discussion that we could pursue further.  I will plan to follow-up with the patient after her imaging.  Per the note from Dr. Claiborne Billings, it sounds as if the patient is planning to pursue care at [facility]  She has been tentatively scheduled for surgery there in early August.  I will have my office reach out to the patient to clarify.  I discussed the assessment and treatment plan with the  patient. The patient was provided with an opportunity to ask questions and all were answered. The patient agreed with the plan and demonstrated an understanding of the instructions.   The patient was advised to call back or see an in-person evaluation if the symptoms worsen or if the condition fails to improve as anticipated.   16 minutes of total time was spent for this patient encounter, including preparation, face-to-face counseling with the patient and coordination of care, and documentation of the encounter.   Jeral Pinch, MD  Division of Gynecologic Oncology  Department of Obstetrics and Gynecology  [facility]

## 2021-11-21 ENCOUNTER — Ambulatory Visit
Admission: RE | Admit: 2021-11-21 | Discharge: 2021-11-21 | Disposition: A | Discharge status: Home / Self Care | Payer: 59 | Source: Ambulatory Visit | Attending: Family Medicine | Admitting: Family Medicine

## 2021-11-21 ENCOUNTER — Telehealth: Payer: Self-pay | Admitting: Gynecologic Oncology

## 2021-11-21 DIAGNOSIS — Z1231 Encounter for screening mammogram for malignant neoplasm of breast: Secondary | ICD-10-CM

## 2021-11-25 ENCOUNTER — Other Ambulatory Visit: Payer: Self-pay | Admitting: Family Medicine

## 2021-11-25 ENCOUNTER — Telehealth: Payer: Self-pay | Admitting: Obstetrics and Gynecology

## 2021-11-25 DIAGNOSIS — R928 Other abnormal and inconclusive findings on diagnostic imaging of breast: Secondary | ICD-10-CM

## 2021-11-26 ENCOUNTER — Telehealth: Payer: 59 | Admitting: Gynecologic Oncology

## 2021-12-01 ENCOUNTER — Ambulatory Visit
Admission: RE | Admit: 2021-12-01 | Discharge: 2021-12-01 | Disposition: A | Discharge status: Home / Self Care | Payer: 59 | Source: Ambulatory Visit | Attending: Family Medicine | Admitting: Family Medicine

## 2021-12-01 ENCOUNTER — Ambulatory Visit: Payer: 59

## 2021-12-01 DIAGNOSIS — R928 Other abnormal and inconclusive findings on diagnostic imaging of breast: Secondary | ICD-10-CM

## 2021-12-08 ENCOUNTER — Encounter (HOSPITAL_COMMUNITY): Payer: 59

## 2021-12-08 ENCOUNTER — Other Ambulatory Visit (HOSPITAL_COMMUNITY): Payer: 59

## 2021-12-12 ENCOUNTER — Ambulatory Visit: Payer: 59 | Admitting: Family Medicine

## 2021-12-16 ENCOUNTER — Encounter: Payer: 59 | Admitting: Gynecologic Oncology

## 2021-12-22 ENCOUNTER — Telehealth: Payer: Self-pay | Admitting: Family Medicine

## 2021-12-22 MED ORDER — FLUCONAZOLE 150 MG PO TABS: 150.0000 mg | ORAL_TABLET | Freq: Once | ORAL | 0 refills | Status: AC

## 2021-12-22 NOTE — Telephone Encounter (Signed)
Prescription for Diflucan sent to pharmacy.  Pt should follow up on 7/27 as scheduled.

## 2021-12-22 NOTE — Telephone Encounter (Signed)
Caller name: Baylen Dea   On DPR? :yes/no: Yes  Call back number:[phone]  Provider they see: Birdie Riddle   Reason for call: PT has appt on 12/25/21.  Pt states that she has yeast infection after taking Jardiance 10 mg. Pt  wants to know if Dr.Tabori can call her in something for her yeast infection. PT pharmacy is [facility] on [address].

## 2021-12-22 NOTE — Telephone Encounter (Signed)
Last OV 11/07/21 and upcoming on 7/27 can pt have treatment for yeast infection or should she wait till OV

## 2021-12-25 ENCOUNTER — Ambulatory Visit: Payer: 59 | Admitting: Family Medicine

## 2021-12-25 ENCOUNTER — Other Ambulatory Visit (HOSPITAL_COMMUNITY): Payer: 59

## 2022-01-06 ENCOUNTER — Ambulatory Visit: Admit: 2022-01-06 | Payer: 59 | Admitting: Gynecologic Oncology

## 2022-01-06 DIAGNOSIS — C541 Malignant neoplasm of endometrium: Secondary | ICD-10-CM

## 2022-01-06 SURGERY — HYSTERECTOMY, TOTAL, ROBOT-ASSISTED, LAPAROSCOPIC, WITH BILATERAL SALPINGO-OOPHORECTOMY
Anesthesia: General

## 2022-03-24 ENCOUNTER — Other Ambulatory Visit: Payer: Self-pay | Admitting: Family Medicine

## 2022-04-14 ENCOUNTER — Other Ambulatory Visit: Payer: Self-pay | Admitting: Family Medicine

## 2022-05-13 ENCOUNTER — Other Ambulatory Visit: Payer: Self-pay | Admitting: Family Medicine

## 2022-05-13 NOTE — Telephone Encounter (Signed)
Patient is requesting a refill of the following medications: Requested Prescriptions   Pending Prescriptions Disp Refills   JARDIANCE 10 MG TABS tablet [Pharmacy Med Name: Jardiance 10 MG Oral Tablet] 30 tablet 0    Sig: TAKE 1 TABLET BY MOUTH ONCE DAILY BEFORE BREAKFAST    Date of patient request: 05/13/22 Last office visit: 04/13/22 Date of last refill: 04/14/22 Last refill amount: 30

## 2022-06-11 ENCOUNTER — Other Ambulatory Visit: Payer: Self-pay | Admitting: Family Medicine

## 2022-12-22 ENCOUNTER — Telehealth: Payer: Self-pay | Admitting: Family Medicine

## 2022-12-22 NOTE — Telephone Encounter (Signed)
Caller name: MERIEL KELLIHER  On DPR?: Yes  Call back number: [phone] (home)  Provider they see: Sheliah Hatch, MD  Reason for call:  Pt called stating she wanted to schedule a physical I checked her last physical it was a No Show 09/2021. She goes into a rant I simply said I'm using the date to make sure it's a year and she hung up.

## 2022-12-22 NOTE — Telephone Encounter (Signed)
I left the pt a VM asking her that if she wants to make an apt w/ Dr Beverely Low to please call us back

## 2022-12-28 ENCOUNTER — Other Ambulatory Visit: Payer: Self-pay | Admitting: Family Medicine

## 2022-12-28 DIAGNOSIS — Z1231 Encounter for screening mammogram for malignant neoplasm of breast: Secondary | ICD-10-CM

## 2022-12-30 ENCOUNTER — Encounter (INDEPENDENT_AMBULATORY_CARE_PROVIDER_SITE_OTHER): Payer: Self-pay

## 2023-01-04 ENCOUNTER — Ambulatory Visit: Admission: RE | Admit: 2023-01-04 | Payer: 59 | Source: Ambulatory Visit

## 2023-01-04 DIAGNOSIS — Z1231 Encounter for screening mammogram for malignant neoplasm of breast: Secondary | ICD-10-CM

## 2023-01-18 ENCOUNTER — Ambulatory Visit: Payer: 59 | Admitting: Family Medicine

## 2023-01-18 ENCOUNTER — Other Ambulatory Visit (INDEPENDENT_AMBULATORY_CARE_PROVIDER_SITE_OTHER): Payer: 59

## 2023-01-18 ENCOUNTER — Encounter: Payer: Self-pay | Admitting: Family Medicine

## 2023-01-18 ENCOUNTER — Other Ambulatory Visit: Payer: 59

## 2023-01-18 VITALS — BP 124/78 | HR 96 | Temp 97.9°F | Resp 17 | Ht 66.0 in | Wt 222.2 lb

## 2023-01-18 DIAGNOSIS — E559 Vitamin D deficiency, unspecified: Secondary | ICD-10-CM | POA: Diagnosis not present

## 2023-01-18 DIAGNOSIS — E119 Type 2 diabetes mellitus without complications: Secondary | ICD-10-CM

## 2023-01-18 DIAGNOSIS — Z1211 Encounter for screening for malignant neoplasm of colon: Secondary | ICD-10-CM

## 2023-01-18 DIAGNOSIS — Z0000 Encounter for general adult medical examination without abnormal findings: Secondary | ICD-10-CM | POA: Diagnosis not present

## 2023-01-18 DIAGNOSIS — E785 Hyperlipidemia, unspecified: Secondary | ICD-10-CM

## 2023-01-18 DIAGNOSIS — E1169 Type 2 diabetes mellitus with other specified complication: Secondary | ICD-10-CM

## 2023-01-18 LAB — TSH: TSH: 1.07 u[IU]/mL (ref 0.35–5.50)

## 2023-01-18 LAB — CBC WITH DIFFERENTIAL/PLATELET
Basophils Absolute: 0 10*3/uL (ref 0.0–0.1)
Basophils Relative: 0.3 % (ref 0.0–3.0)
Eosinophils Absolute: 0 10*3/uL (ref 0.0–0.7)
Eosinophils Relative: 0.3 % (ref 0.0–5.0)
HCT: 45.8 % (ref 36.0–46.0)
Hemoglobin: 14.8 g/dL (ref 12.0–15.0)
Lymphocytes Relative: 29.9 % (ref 12.0–46.0)
Lymphs Abs: 2.6 10*3/uL (ref 0.7–4.0)
MCHC: 32.2 g/dL (ref 30.0–36.0)
MCV: 79.8 fl (ref 78.0–100.0)
Monocytes Absolute: 0.4 10*3/uL (ref 0.1–1.0)
Monocytes Relative: 4.7 % (ref 3.0–12.0)
Neutro Abs: 5.6 10*3/uL (ref 1.4–7.7)
Neutrophils Relative %: 64.8 % (ref 43.0–77.0)
Platelets: 308 10*3/uL (ref 150.0–400.0)
RBC: 5.74 Mil/uL — ABNORMAL HIGH (ref 3.87–5.11)
RDW: 14.4 % (ref 11.5–15.5)
WBC: 8.6 10*3/uL (ref 4.0–10.5)

## 2023-01-18 LAB — HEPATIC FUNCTION PANEL
ALT: 39 U/L — ABNORMAL HIGH (ref 0–35)
AST: 35 U/L (ref 0–37)
Albumin: 4.9 g/dL (ref 3.5–5.2)
Alkaline Phosphatase: 145 U/L — ABNORMAL HIGH (ref 39–117)
Bilirubin, Direct: 0.2 mg/dL (ref 0.0–0.3)
Total Bilirubin: 0.8 mg/dL (ref 0.2–1.2)
Total Protein: 8.7 g/dL — ABNORMAL HIGH (ref 6.0–8.3)

## 2023-01-18 LAB — BASIC METABOLIC PANEL
BUN: 10 mg/dL (ref 6–23)
CO2: 20 mEq/L (ref 19–32)
Calcium: 10.2 mg/dL (ref 8.4–10.5)
Chloride: 105 mEq/L (ref 96–112)
Creatinine, Ser: 0.81 mg/dL (ref 0.40–1.20)
GFR: 86.47 mL/min (ref >60.00)
Glucose, Bld: 205 mg/dL — ABNORMAL HIGH (ref 70–99)
Potassium: 4.2 mEq/L (ref 3.5–5.1)
Sodium: 139 mEq/L (ref 135–145)

## 2023-01-18 LAB — MICROALBUMIN / CREATININE URINE RATIO
Creatinine,U: 46.7 mg/dL
Microalb Creat Ratio: 13.1 mg/g (ref 0.0–30.0)
Microalb, Ur: 6.1 mg/dL — ABNORMAL HIGH (ref 0.0–1.9)

## 2023-01-18 LAB — LIPID PANEL
Cholesterol: 152 mg/dL (ref 0–200)
HDL: 44.5 mg/dL (ref >39.00)
LDL Cholesterol: 89 mg/dL (ref 0–99)
NonHDL: 107.48
Total CHOL/HDL Ratio: 3
Triglycerides: 92 mg/dL (ref 0.0–149.0)
VLDL: 18.4 mg/dL (ref 0.0–40.0)

## 2023-01-18 LAB — VITAMIN D 25 HYDROXY (VIT D DEFICIENCY, FRACTURES): VITD: 45.72 ng/mL (ref 30.00–100.00)

## 2023-01-18 MED ORDER — CLOTRIMAZOLE-BETAMETHASONE 1-0.05 % EX CREA: 1.0000 | TOPICAL_CREAM | Freq: Two times a day (BID) | CUTANEOUS | 1 refills | Status: DC

## 2023-01-18 MED ORDER — EMPAGLIFLOZIN 10 MG PO TABS: 10.0000 mg | ORAL_TABLET | Freq: Every day | ORAL | 6 refills | Status: DC

## 2023-01-18 NOTE — Assessment & Plan Note (Signed)
Chronic problem.  Was attempting to control w/ diet and exercise.  Check labs and see if statin needed

## 2023-01-18 NOTE — Assessment & Plan Note (Signed)
Pt's PE WNL w/ exception of BMI.  UTD on pap, mammo, Tdap.  Due for colonoscopy- referral placed.  Check labs.  Anticipatory guidance provided.

## 2023-01-18 NOTE — Assessment & Plan Note (Signed)
Check labs and replete prn. 

## 2023-01-18 NOTE — Assessment & Plan Note (Signed)
Ongoing issue.  Pt's BMI of 35.87 along w/ DM and hyperlipidemia qualify as morbidly obese.  Encouraged low carb diet and regular exercise.  Will continue to follow.

## 2023-01-18 NOTE — Assessment & Plan Note (Signed)
Overdue for f/u.  Foot exam done today.  Microalbumin ordered.  Encouraged her to schedule eye exam.  Check labs and adjust meds prn.

## 2023-01-18 NOTE — Patient Instructions (Signed)
Go to [address] to get your labs done Follow up in 3-4 months to recheck sugar We'll notify you of your lab results and make any changes if needed Continue to work on healthy diet and regular exercise- you can do it! We'll call you to schedule your GI consultation for colonoscopy Call with any questions or concerns Stay Safe!  Stay Healthy! Hang in there!!!

## 2023-01-18 NOTE — Progress Notes (Signed)
   Subjective:    Patient ID: Cassie Rich, female    DOB: 1975-10-27, 47 y.o.   MRN: [id number]  HPI CPE- UTD on mammo, pap, Tdap.  Due for foot exam, eye exam, microalbumin, colonoscopy.  Patient Care Team    Relationship Specialty Notifications Start End  Sheliah Hatch, MD PCP - General Family Medicine  01/20/17   Glendale Chard, DO Consulting Physician Neurology  08/17/19      Health Maintenance  Topic Date Due   Colonoscopy  Never done   Diabetic kidney evaluation - Urine ACR  02/07/2021   OPHTHALMOLOGY EXAM  01/21/2022   HEMOGLOBIN A1C  04/01/2022   Diabetic kidney evaluation - eGFR measurement  11/11/2022   INFLUENZA VACCINE  12/31/2022   MAMMOGRAM  01/04/2024   FOOT EXAM  01/18/2024   PAP SMEAR-Modifier  09/29/2024   DTaP/Tdap/Td (3 - Td or Tdap) 10/16/2030   HPV VACCINES  Aged Out   COVID-19 Vaccine  Discontinued   Hepatitis C Screening  Discontinued   HIV Screening  Discontinued      Review of Systems Patient reports no vision/ hearing changes, adenopathy,fever,  persistant/recurrent hoarseness , swallowing issues, chest pain, palpitations, edema, persistant/recurrent cough, hemoptysis, dyspnea (rest/exertional/paroxysmal nocturnal), gastrointestinal bleeding (melena, rectal bleeding), abdominal pain, significant heartburn, bowel changes, GU symptoms (dysuria, hematuria, incontinence), Gyn symptoms (abnormal  bleeding, pain),  syncope, focal weakness, memory loss, skin/hair/nail changes, abnormal bruising or bleeding, anxiety, or depression.   + 8 lb weight loss + numbness/tingling of hands/feet, L>R    Objective:   Physical Exam General Appearance:    Alert, cooperative, no distress, appears stated age, obese  Head:    Normocephalic, without obvious abnormality, atraumatic  Eyes:    PERRL, conjunctiva/corneas clear, EOM's intact both eyes  Ears:    Normal TM's and external ear canals, both ears  Nose:   Nares normal, septum midline, mucosa normal, no  drainage    or sinus tenderness  Throat:   Lips, mucosa, and tongue normal; teeth and gums normal  Neck:   Supple, symmetrical, trachea midline, no adenopathy;    Thyroid: no enlargement/tenderness/nodules  Back:     Symmetric, no curvature, ROM normal, no CVA tenderness  Lungs:     Clear to auscultation bilaterally, respirations unlabored  Chest Wall:    No tenderness or deformity   Heart:    Regular rate and rhythm, S1 and S2 normal, no murmur, rub   or gallop  Breast Exam:    Deferred to GYN  Abdomen:     Soft, non-tender, bowel sounds active all four quadrants,    no masses, no organomegaly  Genitalia:    Deferred to GYN  Rectal:    Extremities:   Extremities normal, atraumatic, no cyanosis or edema  Pulses:   2+ and symmetric all extremities  Skin:   Skin color, texture, turgor normal, no rashes or lesions  Lymph nodes:   Cervical, supraclavicular, and axillary nodes normal  Neurologic:   CNII-XII intact, normal strength, sensation and reflexes    throughout          Assessment & Plan:

## 2023-01-19 ENCOUNTER — Telehealth: Payer: Self-pay

## 2023-01-19 ENCOUNTER — Other Ambulatory Visit: Payer: Self-pay

## 2023-01-19 ENCOUNTER — Ambulatory Visit (INDEPENDENT_AMBULATORY_CARE_PROVIDER_SITE_OTHER): Payer: 59

## 2023-01-19 DIAGNOSIS — R748 Abnormal levels of other serum enzymes: Secondary | ICD-10-CM

## 2023-01-19 LAB — HEMOGLOBIN A1C
Hgb A1c MFr Bld: 11.5 %{Hb} — ABNORMAL HIGH (ref <5.7)
Mean Plasma Glucose: 283 mg/dL
eAG (mmol/L): 15.7 mmol/L

## 2023-01-19 LAB — GAMMA GT: GGT: 222 U/L — ABNORMAL HIGH (ref 7–51)

## 2023-01-19 MED ORDER — OZEMPIC (0.25 OR 0.5 MG/DOSE) 2 MG/3ML ~~LOC~~ SOPN: PEN_INJECTOR | SUBCUTANEOUS | 1 refills | Status: DC

## 2023-01-19 NOTE — Telephone Encounter (Signed)
-----   Message from Neena Rhymes sent at 01/19/2023  7:41 AM EDT ----- Your A1C has jumped considerably and is now at 11.5%- which indicates poor sugar control.  Based on this, we are going to start Ozempic 0.25mg  weekly x4 weeks and then increase to 0.5mg  weekly x4 weeks.  You will continue your Jardiance daily and please work on low carb diet and regular physical activity.  I sent the Ozempic to your local pharmacy and once we get it approved by insurance, it will be available for pick up.

## 2023-01-19 NOTE — Telephone Encounter (Signed)
This lab was done yesterday morning.  It has not been 24 hrs yet

## 2023-01-19 NOTE — Telephone Encounter (Signed)
We have gotten the GGT results . Pt seen results via my chart

## 2023-01-19 NOTE — Telephone Encounter (Signed)
-----   Message from Neena Rhymes sent at 01/19/2023  7:53 AM EDT ----- Your Alk Phos is mildly elevated.  This is a nonspecific marker but we're going to add a GGT to see if this is coming from your liver or another source.  Remainder of labs are stable w/ exception of high sugar- which I was expecting based on your A1C level

## 2023-01-19 NOTE — Telephone Encounter (Signed)
Per lab we can not add a GGT after 24 hours would you like me to schedule pt a lab only visit ?

## 2023-01-19 NOTE — Telephone Encounter (Signed)
Called Cythina at lab left vm to call me back to double check that we can add they had originally said not after 24 hours

## 2023-01-19 NOTE — Telephone Encounter (Signed)
Pt seen results in my chart . The RUQ [place] has been ordered

## 2023-01-19 NOTE — Telephone Encounter (Signed)
Pt seen results via my chart and the Ozempic has been sent in

## 2023-01-19 NOTE — Telephone Encounter (Signed)
-----   Message from Neena Rhymes sent at 01/19/2023 12:14 PM EDT ----- Your GGT is elevated which indicates there is something going on with our biliary system (liver, gallbladder, etc).  Based on this, I want to get an ultrasound (RUQ ultrasound, dx elevated GGT) to get a better idea of what might be going on.  We will place the order and someone will call you to schedule

## 2023-01-28 ENCOUNTER — Ambulatory Visit (HOSPITAL_BASED_OUTPATIENT_CLINIC_OR_DEPARTMENT_OTHER)
Admission: RE | Admit: 2023-01-28 | Discharge: 2023-01-28 | Disposition: A | Discharge status: Home / Self Care | Payer: 59 | Source: Ambulatory Visit | Attending: Family Medicine | Admitting: Family Medicine

## 2023-01-28 ENCOUNTER — Telehealth: Payer: Self-pay

## 2023-01-28 DIAGNOSIS — R748 Abnormal levels of other serum enzymes: Secondary | ICD-10-CM | POA: Insufficient documentation

## 2023-01-28 NOTE — Telephone Encounter (Signed)
-----   Message from Neena Rhymes sent at 01/28/2023  9:03 AM EDT ----- Thankfully ultrasound doesn't show anything acute.  There is some fatty infiltration of the liver which can cause the liver enzymes to rise.  This will improve w/ low carb diet and regular exercise.

## 2023-01-28 NOTE — Telephone Encounter (Signed)
Pt has seen results via my chart  

## 2023-03-27 ENCOUNTER — Other Ambulatory Visit: Payer: Self-pay | Admitting: Family Medicine

## 2023-03-29 NOTE — Telephone Encounter (Signed)
Please advise on dose.

## 2023-04-13 LAB — HM DIABETES EYE EXAM

## 2023-05-10 ENCOUNTER — Encounter: Payer: Self-pay | Admitting: Family Medicine

## 2023-05-10 NOTE — Telephone Encounter (Signed)
 Care team updated and letter sent for eye exam notes.

## 2023-07-20 ENCOUNTER — Telehealth: Payer: Self-pay | Admitting: Family Medicine

## 2023-07-20 NOTE — Telephone Encounter (Signed)
 Copied from CRM [id number]. Topic: Appointments - Scheduling Inquiry for Clinic >> Jul 20, 2023 10:00 AM Pascal Lux wrote: Reason for CRM: Patient called to schedule her physical but unsuccessful because she stated she got laid off from her job and has until the end of May to schedule her physical. She stated she is available to come in any day and any time.  Scheduled appt April 2 shea states her insurance starts over Jan 1 she's getting laid off after May and needs her physical

## 2023-08-10 ENCOUNTER — Other Ambulatory Visit: Payer: Self-pay | Admitting: Family Medicine

## 2023-08-10 NOTE — Telephone Encounter (Signed)
 Requested Prescriptions   Pending Prescriptions Disp Refills   OZEMPIC, 1 MG/DOSE, 4 MG/3ML SOPN [Pharmacy Med Name: Ozempic (1 MG/DOSE) 4 MG/3ML Subcutaneous Solution Pen-injector] 3 mL 0    Sig: INJECT 1 MG SUBCUTANEOUSLY  ONCE A WEEK     Date of patient request: 08/10/2023 Last office visit: 01/18/2023 Upcoming visit: 09/01/2023 Date of last refill: 03/29/2023 Last refill amount: 3mL  Should patient continue this dose?

## 2023-09-01 ENCOUNTER — Encounter: Payer: 59 | Admitting: Family Medicine

## 2023-09-09 ENCOUNTER — Other Ambulatory Visit: Payer: Self-pay | Admitting: Family Medicine

## 2023-10-06 ENCOUNTER — Other Ambulatory Visit: Payer: Self-pay

## 2023-10-06 ENCOUNTER — Emergency Department (HOSPITAL_BASED_OUTPATIENT_CLINIC_OR_DEPARTMENT_OTHER)

## 2023-10-06 ENCOUNTER — Encounter (HOSPITAL_BASED_OUTPATIENT_CLINIC_OR_DEPARTMENT_OTHER): Payer: Self-pay

## 2023-10-06 ENCOUNTER — Emergency Department (HOSPITAL_BASED_OUTPATIENT_CLINIC_OR_DEPARTMENT_OTHER)
Admission: EM | Admit: 2023-10-06 | Discharge: 2023-10-07 | Disposition: A | Discharge status: Home / Self Care | Attending: Emergency Medicine | Admitting: Emergency Medicine

## 2023-10-06 DIAGNOSIS — W228XXA Striking against or struck by other objects, initial encounter: Secondary | ICD-10-CM | POA: Insufficient documentation

## 2023-10-06 DIAGNOSIS — S0033XA Contusion of nose, initial encounter: Secondary | ICD-10-CM | POA: Diagnosis not present

## 2023-10-06 DIAGNOSIS — S0992XA Unspecified injury of nose, initial encounter: Secondary | ICD-10-CM | POA: Diagnosis present
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
## 2023-10-07 NOTE — Discharge Instructions (Signed)
 Ice for 20 minutes every 2 hours while awake for the next 2 days.  Follow-up with primary doctor for any new and/or concerning issues.

## 2023-11-17 ENCOUNTER — Other Ambulatory Visit: Payer: Self-pay | Admitting: Family Medicine

## 2023-11-17 ENCOUNTER — Telehealth: Payer: Self-pay

## 2023-11-17 NOTE — Telephone Encounter (Signed)
 Error

## 2023-12-15 ENCOUNTER — Other Ambulatory Visit: Payer: Self-pay | Admitting: Family Medicine

## 2024-01-10 ENCOUNTER — Other Ambulatory Visit: Payer: Self-pay | Admitting: Family Medicine

## 2024-01-10 DIAGNOSIS — Z1231 Encounter for screening mammogram for malignant neoplasm of breast: Secondary | ICD-10-CM

## 2024-01-19 ENCOUNTER — Other Ambulatory Visit

## 2024-01-19 ENCOUNTER — Encounter: Payer: Self-pay | Admitting: Family Medicine

## 2024-01-19 ENCOUNTER — Ambulatory Visit: Admitting: Family Medicine

## 2024-01-19 VITALS — BP 120/72 | HR 81 | Temp 98.1°F | Resp 16 | Ht 66.0 in | Wt 219.0 lb

## 2024-01-19 DIAGNOSIS — F32A Depression, unspecified: Secondary | ICD-10-CM | POA: Diagnosis not present

## 2024-01-19 DIAGNOSIS — E559 Vitamin D deficiency, unspecified: Secondary | ICD-10-CM

## 2024-01-19 DIAGNOSIS — E119 Type 2 diabetes mellitus without complications: Secondary | ICD-10-CM

## 2024-01-19 DIAGNOSIS — Z114 Encounter for screening for human immunodeficiency virus [HIV]: Secondary | ICD-10-CM

## 2024-01-19 DIAGNOSIS — F419 Anxiety disorder, unspecified: Secondary | ICD-10-CM | POA: Diagnosis not present

## 2024-01-19 DIAGNOSIS — Z1159 Encounter for screening for other viral diseases: Secondary | ICD-10-CM

## 2024-01-19 LAB — MICROALBUMIN / CREATININE URINE RATIO
Creatinine,U: 90.5 mg/dL
Microalb Creat Ratio: 20 mg/g (ref 0.0–30.0)
Microalb, Ur: 1.8 mg/dL (ref 0.0–1.9)

## 2024-01-19 LAB — HEPATIC FUNCTION PANEL
ALT: 20 U/L (ref 0–35)
AST: 20 U/L (ref 0–37)
Albumin: 4.5 g/dL (ref 3.5–5.2)
Alkaline Phosphatase: 102 U/L (ref 39–117)
Bilirubin, Direct: 0.2 mg/dL (ref 0.0–0.3)
Total Bilirubin: 0.7 mg/dL (ref 0.2–1.2)
Total Protein: 8.2 g/dL (ref 6.0–8.3)

## 2024-01-19 LAB — CBC WITH DIFFERENTIAL/PLATELET
Basophils Absolute: 0 K/uL (ref 0.0–0.1)
Basophils Relative: 0.1 % (ref 0.0–3.0)
Eosinophils Absolute: 0 K/uL (ref 0.0–0.7)
Eosinophils Relative: 0.4 % (ref 0.0–5.0)
HCT: 41.2 % (ref 36.0–46.0)
Hemoglobin: 13.3 g/dL (ref 12.0–15.0)
Lymphocytes Relative: 34 % (ref 12.0–46.0)
Lymphs Abs: 3.2 K/uL (ref 0.7–4.0)
MCHC: 32.3 g/dL (ref 30.0–36.0)
MCV: 78.4 fl (ref 78.0–100.0)
Monocytes Absolute: 0.5 K/uL (ref 0.1–1.0)
Monocytes Relative: 4.8 % (ref 3.0–12.0)
Neutro Abs: 5.8 K/uL (ref 1.4–7.7)
Neutrophils Relative %: 60.7 % (ref 43.0–77.0)
Platelets: 326 K/uL (ref 150.0–400.0)
RBC: 5.25 Mil/uL — ABNORMAL HIGH (ref 3.87–5.11)
RDW: 14 % (ref 11.5–15.5)
WBC: 9.5 K/uL (ref 4.0–10.5)

## 2024-01-19 LAB — TSH: TSH: 1.94 u[IU]/mL (ref 0.35–5.50)

## 2024-01-19 LAB — LIPID PANEL
Cholesterol: 149 mg/dL (ref 0–200)
HDL: 41 mg/dL (ref >39.00)
LDL Cholesterol: 95 mg/dL (ref 0–99)
NonHDL: 108.36
Total CHOL/HDL Ratio: 4
Triglycerides: 68 mg/dL (ref 0.0–149.0)
VLDL: 13.6 mg/dL (ref 0.0–40.0)

## 2024-01-19 LAB — VITAMIN D 25 HYDROXY (VIT D DEFICIENCY, FRACTURES): VITD: 33.6 ng/mL (ref 30.00–100.00)

## 2024-01-19 LAB — BASIC METABOLIC PANEL WITH GFR
BUN: 9 mg/dL (ref 6–23)
CO2: 27 meq/L (ref 19–32)
Calcium: 9.6 mg/dL (ref 8.4–10.5)
Chloride: 104 meq/L (ref 96–112)
Creatinine, Ser: 0.71 mg/dL (ref 0.40–1.20)
GFR: 100.58 mL/min (ref >60.00)
Glucose, Bld: 128 mg/dL — ABNORMAL HIGH (ref 70–99)
Potassium: 4 meq/L (ref 3.5–5.1)
Sodium: 139 meq/L (ref 135–145)

## 2024-01-19 MED ORDER — FLUOXETINE HCL 10 MG PO CAPS: 10.0000 mg | ORAL_CAPSULE | Freq: Every day | ORAL | 3 refills | Status: AC

## 2024-01-19 NOTE — Progress Notes (Signed)
   Subjective:    Patient ID: Cassie Rich, female    DOB: 25-Nov-1975, 48 y.o.   MRN: [id number]  HPI Depression- pt is here today for her CPE but started crying immediately.  Is overwhelmed at work.  Feels like she is drowning.  Very tearful.  Doesn't have energy or motivation.  Sometimes wonders 'what's the point'.  Has not considered taking time away from work but admits she needs to do something.  Review of Systems For ROS see HPI     Objective:   Physical Exam Vitals reviewed.  Constitutional:      Appearance: Normal appearance. She is not ill-appearing.  HENT:     Head: Normocephalic and atraumatic.  Eyes:     Extraocular Movements: Extraocular movements intact.     Conjunctiva/sclera: Conjunctivae normal.  Cardiovascular:     Rate and Rhythm: Normal rate.  Pulmonary:     Effort: Pulmonary effort is normal. No respiratory distress.  Skin:    General: Skin is warm and dry.  Neurological:     General: No focal deficit present.     Mental Status: She is alert and oriented to person, place, and time.  Psychiatric:     Comments: Tearful, anxious, overwhelmed           Assessment & Plan:

## 2024-01-19 NOTE — Patient Instructions (Signed)
 Follow up in 3 weeks to recheck mood We'll notify you of your lab results and make any changes if needed START the Fluoxetine  once daily Have work fax any FMLA paperwork you may need You are not broken or damaged and you WILL get through this Call with any questions or concerns Hang in there!!!

## 2024-01-19 NOTE — Assessment & Plan Note (Signed)
 Deteriorated.  Originally came for CPE but we had to pivot as she immediately became tearful.  She is able to contract for safety today.  Will provide time out of work for pt to regroup and allow medication to get in system.  Start Fluoxetine  10mg  daily and monitor closely for improvement and/or side effects.  Pt expressed understanding and is in agreement w/ plan.

## 2024-01-20 ENCOUNTER — Ambulatory Visit: Payer: Self-pay | Admitting: Family Medicine

## 2024-01-20 LAB — HEMOGLOBIN A1C
Est. average glucose Bld gHb Est-mCnc: 157 mg/dL
Hgb A1c MFr Bld: 7.1 % — ABNORMAL HIGH (ref 4.8–5.6)

## 2024-01-20 LAB — HIV ANTIBODY (ROUTINE TESTING W REFLEX): HIV 1&2 Ab, 4th Generation: NONREACTIVE

## 2024-01-20 LAB — HEPATITIS C ANTIBODY: Hepatitis C Ab: NONREACTIVE

## 2024-01-20 NOTE — Progress Notes (Signed)
 Called patient and relayed lab results. Patient verbalized understanding. No questions or concerns.

## 2024-01-27 ENCOUNTER — Ambulatory Visit
Admission: RE | Admit: 2024-01-27 | Discharge: 2024-01-27 | Disposition: A | Discharge status: Home / Self Care | Source: Ambulatory Visit | Attending: Family Medicine | Admitting: Family Medicine

## 2024-01-27 DIAGNOSIS — Z1231 Encounter for screening mammogram for malignant neoplasm of breast: Secondary | ICD-10-CM

## 2024-05-01 ENCOUNTER — Telehealth: Payer: Self-pay

## 2024-05-01 NOTE — Telephone Encounter (Signed)
 Please have pt send a mychart message detailing her sxs (what she is experiencing, when it started, etc) and that way we can do a mychart visit and have the necessary documentation without her being seen in office

## 2024-05-01 NOTE — Telephone Encounter (Signed)
 Copied from CRM #[id number]. Topic: Clinical - Medication Question >> May 01, 2024  1:49 PM Cassie Rich wrote: Reason for CRM: Patient called in stated she needs something called in for her yeast infection, just experiencing  itching

## 2024-05-01 NOTE — Telephone Encounter (Signed)
 Called patient and left vm to return call. Patient needs appt. Please schedule appt or patient can go to urgent care

## 2024-05-01 NOTE — Telephone Encounter (Signed)
 Patient called back. She cannot get off work to schedule a visit. She also said that she cannot afford to go to an urgent care. She really would like medication to be sent in. Only sx is itching and that started Friday.

## 2024-05-02 ENCOUNTER — Encounter: Payer: Self-pay | Admitting: Family Medicine

## 2024-05-02 DIAGNOSIS — N760 Acute vaginitis: Secondary | ICD-10-CM

## 2024-05-02 NOTE — Telephone Encounter (Signed)
 Called patient and she will send in Cassie Rich message.

## 2024-05-02 NOTE — Telephone Encounter (Signed)
 Patient sent mychart message about her sx to rcv medication

## 2024-05-03 MED ORDER — FLUCONAZOLE 150 MG PO TABS: 150.0000 mg | ORAL_TABLET | Freq: Once | ORAL | 0 refills | Status: AC

## 2024-05-03 NOTE — Telephone Encounter (Signed)
 [facility] VISIT   Patient agreed to [facility] visit and is aware that copayment and coinsurance may apply. Patient was treated using telemedicine according to accepted telemedicine protocols.  Subjective:   Patient complains of vaginal itching  Patient Active Problem List   Diagnosis Date Noted   Adenocarcinoma [facility])    Endometrial cancer (HCC) 11/09/2021   Hyperlipidemia associated with type 2 diabetes mellitus (HCC) 10/15/2020   Vitamin D  deficiency 02/08/2020   Anxiety and depression 03/29/2019   Diabetes mellitus without complication (HCC) 01/03/2018   Morbid obesity (HCC) 12/18/2013   Physical exam 10/27/2012   INTERNAL HEMORRHOIDS 07/03/2009   CARPAL TUNNEL SYNDROME, RIGHT 06/07/2008   Social History   Tobacco Use   Smoking status: Never   Smokeless tobacco: Never  Substance Use Topics   Alcohol use: Yes    Alcohol/week: 4.0 - 6.0 standard drinks of alcohol    Types: 4 - 6 Standard drinks or equivalent per week    Comment: occ    Current Outpatient Medications:    fluconazole  (DIFLUCAN ) 150 MG tablet, Take 1 tablet (150 mg total) by mouth once for 1 dose., Disp: 1 tablet, Rfl: 0   Cholecalciferol (VITAMIN D3 PO), Take by mouth daily., Disp: , Rfl:    FLUoxetine  (PROZAC ) 10 MG capsule, Take 1 capsule (10 mg total) by mouth daily., Disp: 30 capsule, Rfl: 3   ibuprofen  (ADVIL ) 800 MG tablet, Take 1 tablet (800 mg total) by mouth every 8 (eight) hours as needed for moderate pain. For AFTER surgery only, Disp: 30 tablet, Rfl: 0  No Known Allergies  Assessment and Plan:   Diagnosis: vaginitis. Please see myChart communication and orders below.   No orders of the defined types were placed in this encounter.  Meds ordered this encounter  Medications   fluconazole  (DIFLUCAN ) 150 MG tablet    Sig: Take 1 tablet (150 mg total) by mouth once for 1 dose.    Dispense:  1 tablet    Refill:  0    Comer Greet, MD 05/03/2024  A total of 5 minutes were spent by me to  personally review the patient-generated inquiry, review patient records and data pertinent to assessment of the patient's problem, develop a management plan including generation of prescriptions and/or orders, and on subsequent communication with the patient through secure the MyChart portal service.   There is no separately reported E/M service related to this service in the past 7 days nor does the patient have an upcoming soonest available appointment for this issue. This work was completed in less than 7 days.   The patient consented to this service today (see patient agreement prior to ongoing communication). Patient counseled regarding the need for in-person exam for certain conditions and was advised to call the office if any changing or worsening symptoms occur.   The codes to be used for the E/M service are: [x]   99421 for 5-10 minutes of time spent on the inquiry. []   [id number] for 11-20 minutes. []   [id number] for 21+ minutes.

## 2024-06-20 ENCOUNTER — Ambulatory Visit: Admitting: Family Medicine
